# Patient Record
Sex: Female | Born: 1985 | Hispanic: No | Marital: Married | Smoking: Never smoker
Health system: Southern US, Community
[De-identification: ages and names within clinical notes are randomized; demographics above are authoritative.]

## PROBLEM LIST (undated history)

## (undated) ENCOUNTER — Inpatient Hospital Stay (HOSPITAL_COMMUNITY): Payer: Self-pay

## (undated) DIAGNOSIS — E039 Hypothyroidism, unspecified: Secondary | ICD-10-CM

## (undated) DIAGNOSIS — Z8759 Personal history of other complications of pregnancy, childbirth and the puerperium: Secondary | ICD-10-CM

## (undated) DIAGNOSIS — F329 Major depressive disorder, single episode, unspecified: Secondary | ICD-10-CM

## (undated) DIAGNOSIS — F32A Depression, unspecified: Secondary | ICD-10-CM

## (undated) DIAGNOSIS — O24419 Gestational diabetes mellitus in pregnancy, unspecified control: Secondary | ICD-10-CM

## (undated) DIAGNOSIS — Z8719 Personal history of other diseases of the digestive system: Secondary | ICD-10-CM

## (undated) DIAGNOSIS — K831 Obstruction of bile duct: Secondary | ICD-10-CM

## (undated) HISTORY — DX: Gestational diabetes mellitus in pregnancy, unspecified control: O24.419

## (undated) HISTORY — DX: Depression, unspecified: F32.A

## (undated) HISTORY — DX: Personal history of other diseases of the digestive system: Z87.59

## (undated) HISTORY — DX: Personal history of other diseases of the digestive system: Z87.19

## (undated) HISTORY — DX: Major depressive disorder, single episode, unspecified: F32.9

## (undated) HISTORY — PX: NO PAST SURGERIES: SHX2092

---

## 2016-07-25 LAB — OB RESULTS CONSOLE HIV ANTIBODY (ROUTINE TESTING): HIV: NONREACTIVE

## 2016-07-25 LAB — OB RESULTS CONSOLE RPR: RPR: NONREACTIVE

## 2016-07-25 LAB — OB RESULTS CONSOLE ABO/RH: RH TYPE: POSITIVE

## 2016-07-25 LAB — OB RESULTS CONSOLE HEPATITIS B SURFACE ANTIGEN: HEP B S AG: NEGATIVE

## 2016-07-25 LAB — OB RESULTS CONSOLE ANTIBODY SCREEN: Antibody Screen: NEGATIVE

## 2016-07-25 LAB — OB RESULTS CONSOLE RUBELLA ANTIBODY, IGM: RUBELLA: IMMUNE

## 2016-11-06 ENCOUNTER — Inpatient Hospital Stay (HOSPITAL_COMMUNITY)
Admission: AD | Admit: 2016-11-06 | Discharge: 2016-11-06 | Disposition: A | Discharge status: Home / Self Care | Payer: Medicaid Other | Source: Ambulatory Visit | Attending: Obstetrics and Gynecology | Admitting: Obstetrics and Gynecology

## 2016-11-06 ENCOUNTER — Encounter (HOSPITAL_COMMUNITY): Payer: Self-pay | Admitting: *Deleted

## 2016-11-06 DIAGNOSIS — O26892 Other specified pregnancy related conditions, second trimester: Secondary | ICD-10-CM | POA: Diagnosis not present

## 2016-11-06 DIAGNOSIS — O99282 Endocrine, nutritional and metabolic diseases complicating pregnancy, second trimester: Secondary | ICD-10-CM | POA: Diagnosis not present

## 2016-11-06 DIAGNOSIS — R109 Unspecified abdominal pain: Secondary | ICD-10-CM

## 2016-11-06 DIAGNOSIS — R1033 Periumbilical pain: Secondary | ICD-10-CM | POA: Insufficient documentation

## 2016-11-06 DIAGNOSIS — E0590 Thyrotoxicosis, unspecified without thyrotoxic crisis or storm: Secondary | ICD-10-CM | POA: Insufficient documentation

## 2016-11-06 DIAGNOSIS — Z3A24 24 weeks gestation of pregnancy: Secondary | ICD-10-CM | POA: Insufficient documentation

## 2016-11-06 DIAGNOSIS — Z880 Allergy status to penicillin: Secondary | ICD-10-CM | POA: Insufficient documentation

## 2016-11-06 LAB — URINALYSIS, ROUTINE W REFLEX MICROSCOPIC
BILIRUBIN URINE: NEGATIVE
Glucose, UA: NEGATIVE mg/dL
Hgb urine dipstick: NEGATIVE
KETONES UR: NEGATIVE mg/dL
LEUKOCYTES UA: NEGATIVE
NITRITE: NEGATIVE
PH: 7 (ref 5.0–8.0)
PROTEIN: NEGATIVE mg/dL
Specific Gravity, Urine: 1.003 — ABNORMAL LOW (ref 1.005–1.030)

## 2016-11-06 MED ORDER — ACETAMINOPHEN 500 MG PO TABS: 1000.0000 mg | ORAL_TABLET | Freq: Four times a day (QID) | ORAL | 5 refills | Status: DC | PRN

## 2016-11-06 NOTE — Discharge Instructions (Signed)
Abdominal Pain During Pregnancy °Belly (abdominal) pain is common during pregnancy. Most of the time, it is not a serious problem. Other times, it can be a sign that something is wrong with the pregnancy. Always tell your doctor if you have belly pain. °Follow these instructions at home: °Monitor your belly pain for any changes. The following actions may help you feel better: °· Do not have sex (intercourse) or put anything in your vagina until you feel better. °· Rest until your pain stops. °· Drink clear fluids if you feel sick to your stomach (nauseous). Do not eat solid food until you feel better. °· Only take medicine as told by your doctor. °· Keep all doctor visits as told. °Get help right away if: °· You are bleeding, leaking fluid, or pieces of tissue come out of your vagina. °· You have more pain or cramping. °· You keep throwing up (vomiting). °· You have pain when you pee (urinate) or have blood in your pee. °· You have a fever. °· You do not feel your baby moving as much. °· You feel very weak or feel like passing out. °· You have trouble breathing, with or without belly pain. °· You have a very bad headache and belly pain. °· You have fluid leaking from your vagina and belly pain. °· You keep having watery poop (diarrhea). °· Your belly pain does not go away after resting, or the pain gets worse. °This information is not intended to replace advice given to you by your health care provider. Make sure you discuss any questions you have with your health care provider. °Document Released: 10/31/2009 Document Revised: 06/20/2016 Document Reviewed: 06/11/2013 °Elsevier Interactive Patient Education © 2017 Elsevier Inc. ° °

## 2016-11-06 NOTE — MAU Provider Note (Signed)
[facility] MAU Attending Note  History     CSN: [id number]  Arrival date & time 11/06/16  1810   First Provider Initiated Contact with Patient 11/06/16 2005      Chief Complaint  Patient presents with  . Abdominal Pain    Katrina [name] is a 30 y.o. G1P0 at 8440w4d who presents to MAU today for evaluation of acute periumbilical pain for a few hours.  Thought something was wrong with baby or placenta. Denies contractions, LOF, bleeding; reports good fetal movement.  Denies any abnormal vaginal discharge, fevers, chills, sweats, dysuria, nausea, vomiting, other GI or GU symptoms or other general symptoms.   Obstetric History   G1   P0   T0   P0   A0   L0    SAB0   TAB0   Ectopic0   Multiple0   Live Births0     # Outcome Date GA Lbr Len/2nd Weight Sex Delivery Anes PTL Lv  1 Current               Past Medical History:  Diagnosis Date  . Hyperthyroidism     Past Surgical History:  Procedure Laterality Date  . NO PAST SURGERIES      No family history on file.  Social History  Substance Use Topics  . Smoking status: Not on file  . Smokeless tobacco: Not on file  . Alcohol use Not on file    Allergies  Allergen Reactions  . Penicillins Rash    Has patient had a PCN reaction causing immediate rash, facial/tongue/throat swelling, SOB or lightheadedness with hypotension: Unknown Has patient had a PCN reaction causing severe rash involving mucus membranes or skin necrosis: Unknown Has patient had a PCN reaction that required hospitalization Unknown Has patient had a PCN reaction occurring within the last 10 years: No If all of the above answers are "NO", then may proceed with Cephalosporin use.     Prescriptions Prior to Admission  Medication Sig Dispense Refill Last Dose  . levothyroxine (SYNTHROID, LEVOTHROID) 50 MCG tablet Take 50 mcg by mouth daily before breakfast.   0 Past Week at Unknown time  . Prenatal Vit-Fe Fumarate-FA (PRENATAL MULTIVITAMIN)  TABS tablet Take 1 tablet by mouth at bedtime.   11/05/2016 at Unknown time  . ranitidine (ZANTAC) 150 MG tablet Take 150 mg by mouth daily as needed for heartburn.   0 Past Week at Unknown time     Physical Exam  BP 108/63 (BP Location: Right Arm)   Pulse 85   Temp 98.8 F (37.1 C) (Oral)   Resp 18   Wt 145 lb 6.4 oz (66 kg)  GENERAL: Well-developed, well-nourished female in no acute distress  SKIN: Warm, dry and without erythema PSYCH: Normal mood and affect HEENT: Normocephalic, atraumatic.   LUNGS: Normal respiratory effort, normal breath sounds HEART: Regular rate noted ABDOMEN: Soft, nondistended, gravid. Point tenderness around umbilicus corresponding to rectus diathesis, no umbilical hernia/defect palpated.  EXTREMITIES: No edema, no cyanosis, normal range of movement   Labs and Imaging   Results for orders placed or performed during the hospital encounter of 11/06/16 (from the past 24 hour(s))  Urinalysis, Routine w reflex microscopic     Status: Abnormal   Collection Time: 11/06/16  6:56 PM  Result Value Ref Range   Color, Urine STRAW (A) YELLOW   APPearance CLEAR CLEAR   Specific Gravity, Urine 1.003 (L) 1.005 - 1.030   pH 7.0 5.0 - 8.0   Glucose,  UA NEGATIVE NEGATIVE mg/dL   Hgb urine dipstick NEGATIVE NEGATIVE   Bilirubin Urine NEGATIVE NEGATIVE   Ketones, ur NEGATIVE NEGATIVE mg/dL   Protein, ur NEGATIVE NEGATIVE mg/dL   Nitrite NEGATIVE NEGATIVE   Leukocytes, UA NEGATIVE NEGATIVE   No results found.  Assessment and Plan   1. Acute periumbilical pain   2. Abdominal pain during pregnancy in second trimester    IUP at 6860w4d Assured patient that pain is due to enlarging uterus causing muscle separation, told to follow up with OB provider for worsening pain, hernia or other symptoms.  Advised Tylenol for pain Was told to return to MAU for any pain, bleeding or other concerns, or if her condition were to change or worsen. Discharged to home in stable  condition D[facility] Henderson CloudHorvath[facility], and she agreed with my plan of care.       Medication List    TAKE these medications   acetaminophen 500 MG tablet Commonly known as:  TYLENOL Take 2 tablets (1,000 mg total) by mouth every 6 (six) hours as needed.   levothyroxine 50 MCG tablet Commonly known as:  SYNTHROID, LEVOTHROID Take 50 mcg by mouth daily before breakfast.   prenatal multivitamin Tabs tablet Take 1 tablet by mouth at bedtime.   ranitidine 150 MG tablet Commonly known as:  ZANTAC Take 150 mg by mouth daily as needed for heartburn.        Jaynie CollinsUGONNA  Violet Seabury, MD, FACOG Attending Obstetrician & Gynecologist, [facility], [facility]

## 2016-11-06 NOTE — MAU Note (Signed)
She was in dr's office today. Started having pain in abd and around her belly button.  Pain was continuous.

## 2016-11-26 NOTE — L&D Delivery Note (Signed)
Patient was C/C/+2 and pushed for 120 minutes with epidural.   NSVD  female infant, Apgars 8,9, weight P.   The patient had a mid line second degree perineal laceration repaired with 2-0 vicrylR. Fundus was firm. EBL was expected amount. Placenta was delivered intact. Vagina was clear.  Baby was vigorous and doing skin to skin with mother.  Katrina [name] A

## 2017-01-22 ENCOUNTER — Encounter (HOSPITAL_COMMUNITY): Payer: Self-pay | Admitting: *Deleted

## 2017-01-22 ENCOUNTER — Inpatient Hospital Stay (HOSPITAL_COMMUNITY)
Admission: AD | Admit: 2017-01-22 | Discharge: 2017-01-22 | Disposition: A | Discharge status: Home / Self Care | Payer: Medicaid Other | Source: Ambulatory Visit | Attending: Obstetrics and Gynecology | Admitting: Obstetrics and Gynecology

## 2017-01-22 DIAGNOSIS — O99283 Endocrine, nutritional and metabolic diseases complicating pregnancy, third trimester: Secondary | ICD-10-CM | POA: Diagnosis not present

## 2017-01-22 DIAGNOSIS — Z3A35 35 weeks gestation of pregnancy: Secondary | ICD-10-CM | POA: Insufficient documentation

## 2017-01-22 DIAGNOSIS — Z880 Allergy status to penicillin: Secondary | ICD-10-CM | POA: Diagnosis not present

## 2017-01-22 DIAGNOSIS — R102 Pelvic and perineal pain: Secondary | ICD-10-CM | POA: Diagnosis present

## 2017-01-22 DIAGNOSIS — O26893 Other specified pregnancy related conditions, third trimester: Secondary | ICD-10-CM | POA: Diagnosis not present

## 2017-01-22 DIAGNOSIS — R109 Unspecified abdominal pain: Secondary | ICD-10-CM

## 2017-01-22 DIAGNOSIS — E039 Hypothyroidism, unspecified: Secondary | ICD-10-CM | POA: Diagnosis not present

## 2017-01-22 HISTORY — DX: Hypothyroidism, unspecified: E03.9

## 2017-01-22 LAB — URINALYSIS, ROUTINE W REFLEX MICROSCOPIC
BILIRUBIN URINE: NEGATIVE
GLUCOSE, UA: NEGATIVE mg/dL
Hgb urine dipstick: NEGATIVE
Ketones, ur: NEGATIVE mg/dL
Nitrite: NEGATIVE
PH: 6 (ref 5.0–8.0)
Protein, ur: NEGATIVE mg/dL
Specific Gravity, Urine: 1.006 (ref 1.005–1.030)

## 2017-01-22 NOTE — MAU Note (Signed)
Pt reports abdominal pain since today at 10am. The pain also hurts every night when she changes position. The pain is around her umbilicus. She also was told that she has a hernia in her 6th month of pregnancy. Pain 4/10. Pt has not taken any pain medicine and doesn't want to take anything for pain. Pt was concerned about decreased movement also. She called her her doctor and they advised her to come and get checked out. Pt started to feel the baby move after drinking water.

## 2017-01-22 NOTE — MAU Provider Note (Signed)
Chief Complaint:  Abdominal Pain   First Provider Initiated Contact with Patient 01/22/17 1742      HPI: Katrina [name] is a 31 y.o. G1P0 at 6228w4d who presents to maternity admissions reporting intermittent pain in her umbilical area x 2 days.  The pain is intermittent, lasting several minutes when it occurs.  This is a new symptom. It is not improved with position change or increase water intake.  It has no associated symptoms. She reports good fetal movement, denies LOF, vaginal bleeding, vaginal itching/burning, urinary symptoms, h/a, dizziness, n/v, or fever/chills.    HPI  Past Medical History: Past Medical History:  Diagnosis Date  . Hypothyroidism     Past obstetric history: OB History  Gravida Para Term Preterm AB Living  1            SAB TAB Ectopic Multiple Live Births               # Outcome Date GA Lbr Len/2nd Weight Sex Delivery Anes PTL Lv  1 Current               Past Surgical History: Past Surgical History:  Procedure Laterality Date  . NO PAST SURGERIES      Family History: No family history on file.  Social History: Social History  Substance Use Topics  . Smoking status: Never Smoker  . Smokeless tobacco: Never Used  . Alcohol use Not on file    Allergies:  Allergies  Allergen Reactions  . Penicillins Rash    Has patient had a PCN reaction causing immediate rash, facial/tongue/throat swelling, SOB or lightheadedness with hypotension: Unknown Has patient had a PCN reaction causing severe rash involving mucus membranes or skin necrosis: Unknown Has patient had a PCN reaction that required hospitalization Unknown Has patient had a PCN reaction occurring within the last 10 years: No If all of the above answers are "NO", then may proceed with Cephalosporin use.     Meds:  Prescriptions Prior to Admission  Medication Sig Dispense Refill Last Dose  . acetaminophen (TYLENOL) 500 MG tablet Take 2 tablets (1,000 mg total) by mouth every 6 (six)  hours as needed. 30 tablet 5 Past Month at Unknown time  . levothyroxine (SYNTHROID, LEVOTHROID) 75 MCG tablet Take 75 mcg by mouth daily before breakfast.   01/22/2017 at Unknown time  . Prenatal Vit-Fe Fumarate-FA (PRENATAL MULTIVITAMIN) TABS tablet Take 1 tablet by mouth at bedtime.   01/22/2017 at Unknown time  . ranitidine (ZANTAC) 150 MG tablet Take 150 mg by mouth daily as needed for heartburn.   0 01/22/2017 at Unknown time    ROS:  Review of Systems  Constitutional: Negative for chills, fatigue and fever.  Eyes: Negative for visual disturbance.  Respiratory: Negative for shortness of breath.   Cardiovascular: Negative for chest pain.  Gastrointestinal: Positive for abdominal pain. Negative for nausea and vomiting.  Genitourinary: Negative for difficulty urinating, dysuria, flank pain, pelvic pain, vaginal bleeding, vaginal discharge and vaginal pain.  Neurological: Negative for dizziness and headaches.  Psychiatric/Behavioral: Negative.      I have reviewed patient's Past Medical Hx, Surgical Hx, Family Hx, Social Hx, medications and allergies.   Physical Exam  Patient Vitals for the past 24 hrs:  BP Temp Temp src Pulse Resp Weight  01/22/17 1701 120/72 98.2 F (36.8 C) Oral 89 16 164 lb 8 oz (74.6 kg)   Constitutional: Well-developed, well-nourished female in no acute distress.  Cardiovascular: normal rate Respiratory: normal effort GI: Abd  soft, non-tender, gravid appropriate for gestational age.  MS: Extremities nontender, no edema, normal ROM Neurologic: Alert and oriented x 4.  GU: Neg CVAT.   Dilation: 1 Effacement (%): Thick Cervical Position: Posterior Presentation: Vertex Exam by:: Leftwich-Kirby, L CNM  FHT:  Baseline 145 , moderate variability, accelerations present, no decelerations Contractions: None on toco or to palpation    Labs: Results for orders placed or performed during the hospital encounter of 01/22/17 (from the past 24 hour(s))  Urinalysis,  Routine w reflex microscopic     Status: Abnormal   Collection Time: 01/22/17  5:05 PM  Result Value Ref Range   Color, Urine STRAW (A) YELLOW   APPearance HAZY (A) CLEAR   Specific Gravity, Urine 1.006 1.005 - 1.030   pH 6.0 5.0 - 8.0   Glucose, UA NEGATIVE NEGATIVE mg/dL   Hgb urine dipstick NEGATIVE NEGATIVE   Bilirubin Urine NEGATIVE NEGATIVE   Ketones, ur NEGATIVE NEGATIVE mg/dL   Protein, ur NEGATIVE NEGATIVE mg/dL   Nitrite NEGATIVE NEGATIVE   Leukocytes, UA TRACE (A) NEGATIVE   RBC / HPF 0-5 0 - 5 RBC/hpf   WBC, UA 0-5 0 - 5 WBC/hpf   Bacteria, UA RARE (A) NONE SEEN   Squamous Epithelial / LPF 6-30 (A) NONE SEEN   Mucous PRESENT       Imaging:  No results found.  MAU Course/MDM: I have ordered labs and reviewed results.  NST reviewed and reactive No evidence of preterm labor or acute abnormality today Reassurance provided to pt Consult Dr Mindi Slicker with presentation, exam findings and test results.  D/C home with preterm labor precautions. Keep scheduled appts in office. Pt stable at time of discharge.  Today's evaluation included a work-up for preterm labor which can be life-threatening for both mom and baby.  Assessment: 1. Abdominal pain during pregnancy in third trimester     Plan: Discharge home Labor precautions and fetal kick counts  Follow-up Information    [facility] Follow up.   Why:  As scheduled on Monday, return to MAU as needed for emergencies. Contact information: [address]  [address] [place] [address] [phone]          Allergies as of 01/22/2017      Reactions   Penicillins Rash   Has patient had a PCN reaction causing immediate rash, facial/tongue/throat swelling, SOB or lightheadedness with hypotension: Unknown Has patient had a PCN reaction causing severe rash involving mucus membranes or skin necrosis: Unknown Has patient had a PCN reaction that required hospitalization Unknown Has patient had a PCN  reaction occurring within the last 10 years: No If all of the above answers are "NO", then may proceed with Cephalosporin use.      Medication List    TAKE these medications   acetaminophen 500 MG tablet Commonly known as:  TYLENOL Take 2 tablets (1,000 mg total) by mouth every 6 (six) hours as needed.   levothyroxine 75 MCG tablet Commonly known as:  SYNTHROID, LEVOTHROID Take 75 mcg by mouth daily before breakfast.   prenatal multivitamin Tabs tablet Take 1 tablet by mouth at bedtime.   ranitidine 150 MG tablet Commonly known as:  ZANTAC Take 150 mg by mouth daily as needed for heartburn.       Sharen Counter Certified Nurse-Midwife 01/22/2017 6:23 PM

## 2017-01-22 NOTE — Discharge Instructions (Signed)
Abdominal Pain During Pregnancy  Abdominal pain is common in pregnancy. Most of the time, it does not cause harm. There are many causes of abdominal pain. Some causes are more serious than others and sometimes the cause is not known. Abdominal pain can be a sign that something is very wrong with the pregnancy or the pain may have nothing to do with the pregnancy. Always tell your health care provider if you have any abdominal pain.  Follow these instructions at home:  · Do not have sex or put anything in your vagina until your symptoms go away completely.  · Watch your abdominal pain for any changes.  · Get plenty of rest until your pain improves.  · Drink enough fluid to keep your urine clear or pale yellow.  · Take over-the-counter or prescription medicines only as told by your health care provider.  · Keep all follow-up visits as told by your health care provider. This is important.  Contact a health care provider if:  · You have a fever.  · Your pain gets worse or you have cramping.  · Your pain continues after resting.  Get help right away if:  · You are bleeding, leaking fluid, or passing tissue from the vagina.  · You have vomiting or diarrhea that does not go away.  · You have painful or bloody urination.  · You notice a decrease in your baby's movements.  · You feel very weak or faint.  · You have shortness of breath.  · You develop a severe headache with abdominal pain.  · You have abnormal vaginal discharge with abdominal pain.  This information is not intended to replace advice given to you by your health care provider. Make sure you discuss any questions you have with your health care provider.  Document Released: 11/12/2005 Document Revised: 08/23/2016 Document Reviewed: 06/11/2013  Elsevier Interactive Patient Education © 2017 Elsevier Inc.

## 2017-01-22 NOTE — MAU Note (Signed)
Had pain around umbilicus at 1000.  Pain comes and goes, not as bad now.  Baby was not moving as much this morning.  Called dr, drank cold water, was on left side, then baby got active.

## 2017-01-28 LAB — OB RESULTS CONSOLE GBS: STREP GROUP B AG: NEGATIVE

## 2017-02-05 ENCOUNTER — Encounter (HOSPITAL_COMMUNITY): Payer: Self-pay | Admitting: *Deleted

## 2017-02-05 ENCOUNTER — Inpatient Hospital Stay (HOSPITAL_COMMUNITY)
Admission: AD | Admit: 2017-02-05 | Discharge: 2017-02-08 | DRG: 775 | Disposition: A | Discharge status: Home / Self Care | Payer: Medicaid Other | Source: Ambulatory Visit | Attending: Obstetrics and Gynecology | Admitting: Obstetrics and Gynecology

## 2017-02-05 ENCOUNTER — Inpatient Hospital Stay (HOSPITAL_COMMUNITY): Payer: Medicaid Other | Admitting: Anesthesiology

## 2017-02-05 DIAGNOSIS — O99284 Endocrine, nutritional and metabolic diseases complicating childbirth: Secondary | ICD-10-CM | POA: Diagnosis present

## 2017-02-05 DIAGNOSIS — E039 Hypothyroidism, unspecified: Secondary | ICD-10-CM | POA: Diagnosis present

## 2017-02-05 DIAGNOSIS — O2662 Liver and biliary tract disorders in childbirth: Secondary | ICD-10-CM | POA: Diagnosis present

## 2017-02-05 DIAGNOSIS — Z3A37 37 weeks gestation of pregnancy: Secondary | ICD-10-CM

## 2017-02-05 DIAGNOSIS — K831 Obstruction of bile duct: Secondary | ICD-10-CM | POA: Diagnosis present

## 2017-02-05 DIAGNOSIS — O26619 Liver and biliary tract disorders in pregnancy, unspecified trimester: Secondary | ICD-10-CM | POA: Diagnosis present

## 2017-02-05 LAB — CBC
HCT: 32.4 % — ABNORMAL LOW (ref 36.0–46.0)
HEMOGLOBIN: 11.1 g/dL — AB (ref 12.0–15.0)
MCH: 30.5 pg (ref 26.0–34.0)
MCHC: 34.3 g/dL (ref 30.0–36.0)
MCV: 89 fL (ref 78.0–100.0)
PLATELETS: 391 10*3/uL (ref 150–400)
RBC: 3.64 MIL/uL — AB (ref 3.87–5.11)
RDW: 13.8 % (ref 11.5–15.5)
WBC: 11.5 10*3/uL — ABNORMAL HIGH (ref 4.0–10.5)

## 2017-02-05 LAB — TYPE AND SCREEN
ABO/RH(D): B POS
ANTIBODY SCREEN: NEGATIVE

## 2017-02-05 LAB — ABO/RH: ABO/RH(D): B POS

## 2017-02-05 MED ORDER — FENTANYL 2.5 MCG/ML BUPIVACAINE 1/10 % EPIDURAL INFUSION (WH - ANES)
14.0000 mL/h | INTRAMUSCULAR | Status: DC | PRN
  Administered 2017-02-05 – 2017-02-06 (×2): 14 mL/h via EPIDURAL
  Filled 2017-02-05 (×2): qty 100

## 2017-02-05 MED ORDER — DIPHENHYDRAMINE HCL 50 MG/ML IJ SOLN: 12.5000 mg | INTRAMUSCULAR | Status: DC | PRN

## 2017-02-05 MED ORDER — EPHEDRINE 5 MG/ML INJ
10.0000 mg | INTRAVENOUS | Status: DC | PRN
  Filled 2017-02-05: qty 4

## 2017-02-05 MED ORDER — FENTANYL 2.5 MCG/ML BUPIVACAINE 1/10 % EPIDURAL INFUSION (WH - ANES): 14.0000 mL/h | INTRAMUSCULAR | Status: DC | PRN

## 2017-02-05 MED ORDER — LACTATED RINGERS IV SOLN
INTRAVENOUS | Status: DC
  Administered 2017-02-05 – 2017-02-06 (×3): via INTRAVENOUS

## 2017-02-05 MED ORDER — DIPHENHYDRAMINE HCL 50 MG/ML IJ SOLN
12.5000 mg | INTRAMUSCULAR | Status: DC | PRN
  Administered 2017-02-05: 12.5 mg via INTRAVENOUS
  Filled 2017-02-05: qty 1

## 2017-02-05 MED ORDER — LIDOCAINE HCL (PF) 1 % IJ SOLN
INTRAMUSCULAR | Status: DC | PRN
  Administered 2017-02-05: 10 mL via EPIDURAL

## 2017-02-05 MED ORDER — OXYCODONE-ACETAMINOPHEN 5-325 MG PO TABS: 1.0000 | ORAL_TABLET | ORAL | Status: DC | PRN

## 2017-02-05 MED ORDER — SOD CITRATE-CITRIC ACID 500-334 MG/5ML PO SOLN: 30.0000 mL | ORAL | Status: DC | PRN

## 2017-02-05 MED ORDER — OXYTOCIN 40 UNITS IN LACTATED RINGERS INFUSION - SIMPLE MED
1.0000 m[IU]/min | INTRAVENOUS | Status: DC
  Administered 2017-02-05: 2 m[IU]/min via INTRAVENOUS
  Filled 2017-02-05: qty 1000

## 2017-02-05 MED ORDER — ONDANSETRON HCL 4 MG/2ML IJ SOLN: 4.0000 mg | Freq: Four times a day (QID) | INTRAMUSCULAR | Status: DC | PRN

## 2017-02-05 MED ORDER — EPHEDRINE 5 MG/ML INJ: 10.0000 mg | INTRAVENOUS | Status: DC | PRN

## 2017-02-05 MED ORDER — PHENYLEPHRINE 40 MCG/ML (10ML) SYRINGE FOR IV PUSH (FOR BLOOD PRESSURE SUPPORT)
80.0000 ug | PREFILLED_SYRINGE | INTRAVENOUS | Status: DC | PRN
  Filled 2017-02-05: qty 5

## 2017-02-05 MED ORDER — PHENYLEPHRINE 40 MCG/ML (10ML) SYRINGE FOR IV PUSH (FOR BLOOD PRESSURE SUPPORT)
80.0000 ug | PREFILLED_SYRINGE | INTRAVENOUS | Status: DC | PRN
  Filled 2017-02-05: qty 10
  Filled 2017-02-05: qty 5

## 2017-02-05 MED ORDER — PHENYLEPHRINE 40 MCG/ML (10ML) SYRINGE FOR IV PUSH (FOR BLOOD PRESSURE SUPPORT): 80.0000 ug | PREFILLED_SYRINGE | INTRAVENOUS | Status: DC | PRN

## 2017-02-05 MED ORDER — OXYCODONE-ACETAMINOPHEN 5-325 MG PO TABS: 2.0000 | ORAL_TABLET | ORAL | Status: DC | PRN

## 2017-02-05 MED ORDER — OXYTOCIN BOLUS FROM INFUSION
500.0000 mL | Freq: Once | INTRAVENOUS | Status: AC
  Administered 2017-02-06: 500 mL via INTRAVENOUS

## 2017-02-05 MED ORDER — OXYTOCIN 40 UNITS IN LACTATED RINGERS INFUSION - SIMPLE MED
2.5000 [IU]/h | INTRAVENOUS | Status: DC
  Administered 2017-02-06: 2.5 [IU]/h via INTRAVENOUS

## 2017-02-05 MED ORDER — LACTATED RINGERS IV SOLN: 500.0000 mL | INTRAVENOUS | Status: DC | PRN

## 2017-02-05 MED ORDER — TERBUTALINE SULFATE 1 MG/ML IJ SOLN
0.2500 mg | Freq: Once | INTRAMUSCULAR | Status: DC | PRN
  Filled 2017-02-05: qty 1

## 2017-02-05 MED ORDER — LIDOCAINE HCL (PF) 1 % IJ SOLN
30.0000 mL | INTRAMUSCULAR | Status: DC | PRN
  Filled 2017-02-05: qty 30

## 2017-02-05 MED ORDER — ACETAMINOPHEN 325 MG PO TABS: 650.0000 mg | ORAL_TABLET | ORAL | Status: DC | PRN

## 2017-02-05 MED ORDER — LACTATED RINGERS IV SOLN: 500.0000 mL | Freq: Once | INTRAVENOUS | Status: DC

## 2017-02-05 MED ORDER — FLEET ENEMA 7-19 GM/118ML RE ENEM: 1.0000 | ENEMA | Freq: Every day | RECTAL | Status: DC | PRN

## 2017-02-05 MED ORDER — LACTATED RINGERS IV SOLN
500.0000 mL | Freq: Once | INTRAVENOUS | Status: AC
  Administered 2017-02-05: 500 mL via INTRAVENOUS

## 2017-02-05 NOTE — Anesthesia Pain Management Evaluation Note (Signed)
  CRNA Pain Management Visit Note  Patient: Katrina [name], 31 y.o., female  "Hello I am a [facility] of the [facility]. We have an anesthesia team available at all times to provide care throughout the hospital, including epidural management and anesthesia for C-section. I don't know your plan for the delivery whether it a natural birth, water birth, IV sedation, nitrous supplementation, doula or epidural, but we want to meet your pain goals."   1.Was your pain managed to your expectations on prior hospitalizations?   No prior hospitalizations  2.What is your expectation for pain management during this hospitalization?     Epidural  3.How can we help you reach that goal?   Record the patient's initial score and the patient's pain goal.   Pain: 0  Pain Goal: 6 [facility] wants you to be able to say your pain was always managed very well.  Laban EmperorMalinova,Felica Chargois Hristova 02/05/2017

## 2017-02-05 NOTE — Anesthesia Preprocedure Evaluation (Signed)
Anesthesia Evaluation  Patient identified by MRN, date of birth, ID band Patient awake    Reviewed: Allergy & Precautions, NPO status , Patient's Chart, lab work & pertinent test results  Airway Mallampati: II  TM Distance: >3 FB Neck ROM: Full    Dental no notable dental hx.    Pulmonary neg pulmonary ROS,    Pulmonary exam normal breath sounds clear to auscultation       Cardiovascular negative cardio ROS Normal cardiovascular exam Rhythm:Regular Rate:Normal     Neuro/Psych negative neurological ROS  negative psych ROS   GI/Hepatic negative GI ROS, Neg liver ROS,   Endo/Other  negative endocrine ROSHypothyroidism   Renal/GU negative Renal ROS  negative genitourinary   Musculoskeletal negative musculoskeletal ROS (+)   Abdominal   Peds negative pediatric ROS (+)  Hematology negative hematology ROS (+)   Anesthesia Other Findings   Reproductive/Obstetrics negative OB ROS                             Anesthesia Physical Anesthesia Plan  ASA: II  Anesthesia Plan: Epidural   Post-op Pain Management:    Induction:   Airway Management Planned: Natural Airway  Additional Equipment:   Intra-op Plan:   Post-operative Plan:   Informed Consent:   Plan Discussed with:   Anesthesia Plan Comments:         Anesthesia Quick Evaluation

## 2017-02-05 NOTE — Anesthesia Procedure Notes (Signed)
Epidural Patient location during procedure: OB Start time: 02/05/2017 8:12 PM End time: 02/05/2017 8:37 PM  Staffing Anesthesiologist: Anitra LauthMILLER, Nakina Spatz RAY Performed: anesthesiologist   Preanesthetic Checklist Completed: patient identified, site marked, surgical consent, pre-op evaluation, timeout performed, IV checked, risks and benefits discussed and monitors and equipment checked  Epidural Patient position: sitting Prep: DuraPrep Patient monitoring: heart rate, cardiac monitor, continuous pulse ox and blood pressure Approach: midline Location: L2-L3 Injection technique: LOR saline  Needle:  Needle type: Tuohy  Needle gauge: 17 G Needle length: 9 cm Needle insertion depth: 5 cm Catheter type: closed end flexible Catheter size: 20 Guage Catheter at skin depth: 8 cm Test dose: negative  Assessment Events: blood not aspirated, injection not painful, no injection resistance, negative IV test and no paresthesia  Additional Notes Reason for block:procedure for pain

## 2017-02-05 NOTE — H&P (Signed)
31 y.o. 7557w4d  G1P0 comes in for induction at term for cholestasis of pregnancy- bile acids all elevated with total bile acid >14.  Otherwise has good fetal movement and no bleeding.  Past Medical History:  Diagnosis Date  . Hypothyroidism     Past Surgical History:  Procedure Laterality Date  . NO PAST SURGERIES      OB History  Gravida Para Term Preterm AB Living  1            SAB TAB Ectopic Multiple Live Births               # Outcome Date GA Lbr Len/2nd Weight Sex Delivery Anes PTL Lv  1 Current               Social History   Social History  . Marital status: Married    Spouse name: N/A  . Number of children: N/A  . Years of education: N/A   Occupational History  . Not on file.   Social History Main Topics  . Smoking status: Never Smoker  . Smokeless tobacco: Never Used  . Alcohol use Not on file  . Drug use: No  . Sexual activity: Not on file   Other Topics Concern  . Not on file   Social History Narrative  . No narrative on file   Penicillins    Prenatal Transfer Tool  Maternal Diabetes: No Genetic Screening: Normal Maternal Ultrasounds/Referrals: Normal Fetal Ultrasounds or other Referrals:  None Maternal Substance Abuse:  No Significant Maternal Medications:  Meds include: Syntroid Significant Maternal Lab Results: Lab values include: Other: elevated bile acids  Other PNC: otherwise uncomplicated.    There were no vitals filed for this visit.   Lungs/Cor:  NAD Abdomen:  soft, gravid Ex:  no cords, erythema SVE:  3/60/-3 FHTs:  130s, good STV, NST R Toco:  q occ   A/P   Term with cholestasis of pregnancy.  GBS neg.  Dolton Shaker A

## 2017-02-06 ENCOUNTER — Encounter (HOSPITAL_COMMUNITY): Payer: Self-pay

## 2017-02-06 LAB — RPR: RPR: NONREACTIVE

## 2017-02-06 MED ORDER — IBUPROFEN 800 MG PO TABS
800.0000 mg | ORAL_TABLET | Freq: Three times a day (TID) | ORAL | Status: DC
  Administered 2017-02-06 – 2017-02-08 (×7): 800 mg via ORAL
  Filled 2017-02-06 (×7): qty 1

## 2017-02-06 MED ORDER — ACETAMINOPHEN 325 MG PO TABS
650.0000 mg | ORAL_TABLET | ORAL | Status: DC | PRN
Start: 2017-02-06 — End: 2017-02-08
  Administered 2017-02-06: 650 mg via ORAL
  Filled 2017-02-06: qty 2

## 2017-02-06 MED ORDER — COCONUT OIL OIL
1.0000 | TOPICAL_OIL | Status: DC | PRN
Start: 2017-02-06 — End: 2017-02-08

## 2017-02-06 MED ORDER — SENNOSIDES-DOCUSATE SODIUM 8.6-50 MG PO TABS
2.0000 | ORAL_TABLET | ORAL | Status: DC
  Administered 2017-02-06 – 2017-02-07 (×2): 2 via ORAL
  Filled 2017-02-06 (×2): qty 2

## 2017-02-06 MED ORDER — SIMETHICONE 80 MG PO CHEW: 80.0000 mg | CHEWABLE_TABLET | ORAL | Status: DC | PRN

## 2017-02-06 MED ORDER — MAGNESIUM HYDROXIDE 400 MG/5ML PO SUSP: 30.0000 mL | ORAL | Status: DC | PRN

## 2017-02-06 MED ORDER — DIBUCAINE 1 % RE OINT: 1.0000 "application " | TOPICAL_OINTMENT | RECTAL | Status: DC | PRN

## 2017-02-06 MED ORDER — FAMOTIDINE 20 MG PO TABS
10.0000 mg | ORAL_TABLET | Freq: Two times a day (BID) | ORAL | Status: DC
  Administered 2017-02-06 – 2017-02-07 (×3): 10 mg via ORAL
  Filled 2017-02-06 (×4): qty 1

## 2017-02-06 MED ORDER — MEASLES, MUMPS & RUBELLA VAC ~~LOC~~ INJ: 0.5000 mL | INJECTION | Freq: Once | SUBCUTANEOUS | Status: DC

## 2017-02-06 MED ORDER — OXYCODONE-ACETAMINOPHEN 5-325 MG PO TABS
1.0000 | ORAL_TABLET | ORAL | Status: DC | PRN
  Administered 2017-02-07 – 2017-02-08 (×2): 1 via ORAL
  Filled 2017-02-06 (×2): qty 1

## 2017-02-06 MED ORDER — METHYLERGONOVINE MALEATE 0.2 MG PO TABS: 0.2000 mg | ORAL_TABLET | ORAL | Status: DC | PRN

## 2017-02-06 MED ORDER — SODIUM CHLORIDE 0.9% FLUSH: 3.0000 mL | INTRAVENOUS | Status: DC | PRN

## 2017-02-06 MED ORDER — PRENATAL MULTIVITAMIN CH
1.0000 | ORAL_TABLET | Freq: Every day | ORAL | Status: DC
  Administered 2017-02-06 – 2017-02-07 (×2): 1 via ORAL
  Filled 2017-02-06 (×2): qty 1

## 2017-02-06 MED ORDER — OXYCODONE-ACETAMINOPHEN 5-325 MG PO TABS: 2.0000 | ORAL_TABLET | ORAL | Status: DC | PRN

## 2017-02-06 MED ORDER — WITCH HAZEL-GLYCERIN EX PADS: 1.0000 "application " | MEDICATED_PAD | CUTANEOUS | Status: DC | PRN

## 2017-02-06 MED ORDER — METHYLERGONOVINE MALEATE 0.2 MG/ML IJ SOLN: 0.2000 mg | INTRAMUSCULAR | Status: DC | PRN

## 2017-02-06 MED ORDER — ONDANSETRON HCL 4 MG PO TABS
4.0000 mg | ORAL_TABLET | ORAL | Status: DC | PRN
  Administered 2017-02-06: 4 mg via ORAL
  Filled 2017-02-06: qty 1

## 2017-02-06 MED ORDER — DIPHENHYDRAMINE HCL 25 MG PO CAPS: 25.0000 mg | ORAL_CAPSULE | Freq: Four times a day (QID) | ORAL | Status: DC | PRN

## 2017-02-06 MED ORDER — SODIUM CHLORIDE 0.9% FLUSH: 3.0000 mL | Freq: Two times a day (BID) | INTRAVENOUS | Status: DC

## 2017-02-06 MED ORDER — LEVOTHYROXINE SODIUM 75 MCG PO TABS
75.0000 ug | ORAL_TABLET | Freq: Every day | ORAL | Status: DC
Start: 2017-02-06 — End: 2017-02-08
  Administered 2017-02-07 – 2017-02-08 (×2): 75 ug via ORAL
  Filled 2017-02-06 (×3): qty 1

## 2017-02-06 MED ORDER — ZOLPIDEM TARTRATE 5 MG PO TABS: 5.0000 mg | ORAL_TABLET | Freq: Every evening | ORAL | Status: DC | PRN

## 2017-02-06 MED ORDER — ONDANSETRON HCL 4 MG/2ML IJ SOLN: 4.0000 mg | INTRAMUSCULAR | Status: DC | PRN

## 2017-02-06 MED ORDER — TETANUS-DIPHTH-ACELL PERTUSSIS 5-2.5-18.5 LF-MCG/0.5 IM SUSP: 0.5000 mL | Freq: Once | INTRAMUSCULAR | Status: DC

## 2017-02-06 MED ORDER — FERROUS SULFATE 325 (65 FE) MG PO TABS
325.0000 mg | ORAL_TABLET | Freq: Two times a day (BID) | ORAL | Status: DC
  Administered 2017-02-06 – 2017-02-08 (×4): 325 mg via ORAL
  Filled 2017-02-06 (×4): qty 1

## 2017-02-06 MED ORDER — BENZOCAINE-MENTHOL 20-0.5 % EX AERO
1.0000 "application " | INHALATION_SPRAY | CUTANEOUS | Status: DC | PRN
  Filled 2017-02-06: qty 56

## 2017-02-06 MED ORDER — SODIUM CHLORIDE 0.9 % IV SOLN: 250.0000 mL | INTRAVENOUS | Status: DC | PRN

## 2017-02-06 NOTE — Progress Notes (Addendum)
Pt up to void at this time. Pt was unable to void. I&O cath performed, pt emptied of 700cc clear, yellow urine. Sheryn BisonGordon, Dasiah Hooley Warner

## 2017-02-06 NOTE — Progress Notes (Signed)
Epidural removed at this time. Blue tip intact, confirmed with Minus Libertyhristy Leshowitz, RN and shown to pt. Small amount of bleeding around site prior to removal, bandaid applied.

## 2017-02-06 NOTE — Progress Notes (Signed)
[facility]. Report given to [place], Charity fundraiserN. Sheryn BisonGordon, Micalah Cabezas Warner

## 2017-02-06 NOTE — Lactation Note (Signed)
This note was copied from a baby's chart. Lactation Consultation Note  Patient Name: Girl Katrina Katrina [name]: 02/06/2017 Reason for consult: Initial assessment Baby at 11 hr of life. Upon entry baby was sleeping. Offered latch help and mom declined. Mom denies breast or nipple pain. She asked for formula samples to use when she goes back to work. She plans to ebf while in the hospital. Discussed baby behavior, feeding frequency, baby belly size, voids, wt loss, breast changes, and nipple care. Mom stated she can manually express and has spoon in room. Given lactation handouts. Aware of OP services and support group.     Maternal Data Has patient been taught Hand Expression?: Yes Does the patient have breastfeeding experience prior to this delivery?: No  Feeding    LATCH Score/Interventions                      Lactation Tools Discussed/Used WIC Program: No   Consult Status Consult Status: Follow-up Katrina [name]: 02/07/17 Follow-up type: In-patient    Katrina Katrina [name] 02/06/2017, 5:23 PM

## 2017-02-06 NOTE — Anesthesia Postprocedure Evaluation (Signed)
Anesthesia Post Note  Patient: Katrina [name]  Procedure(s) Performed: * No procedures listed *  Patient location during evaluation: Mother Baby Anesthesia Type: Epidural Level of consciousness: awake and alert Pain management: pain level controlled Vital Signs Assessment: post-procedure vital signs reviewed and stable Respiratory status: spontaneous breathing Cardiovascular status: blood pressure returned to baseline Postop Assessment: no headache, patient able to bend at knees, no backache, no signs of nausea or vomiting, epidural receding and adequate PO intake Anesthetic complications: no        Last Vitals:  Vitals:   02/06/17 0815 02/06/17 0913  BP: 114/70 (!) 109/55  Pulse: 61 66  Resp:    Temp: 36.8 C 36.6 C    Last Pain:  Vitals:   02/06/17 0913  TempSrc: Oral  PainSc:    Pain Goal:                 [name], [name]

## 2017-02-07 LAB — CBC
HEMATOCRIT: 30 % — AB (ref 36.0–46.0)
HEMOGLOBIN: 10.3 g/dL — AB (ref 12.0–15.0)
MCH: 30.9 pg (ref 26.0–34.0)
MCHC: 34.3 g/dL (ref 30.0–36.0)
MCV: 90.1 fL (ref 78.0–100.0)
Platelets: 352 10*3/uL (ref 150–400)
RBC: 3.33 MIL/uL — ABNORMAL LOW (ref 3.87–5.11)
RDW: 14.2 % (ref 11.5–15.5)
WBC: 15.2 10*3/uL — ABNORMAL HIGH (ref 4.0–10.5)

## 2017-02-07 NOTE — Lactation Note (Signed)
This note was copied from a baby's chart. Lactation Consultation Note  Family sleeping.  Did speak to mother briefly but will check on family later tonight.  Patient Name: Katrina [name] WUJWJ'XToday's Date: 02/07/2017     Maternal Data    Feeding    LATCH Score/Interventions                      Lactation Tools Discussed/Used     Consult Status      Dahlia ByesBerkelhammer, Meko Masterson [facility] 02/07/2017, 5:38 PM

## 2017-02-07 NOTE — Progress Notes (Signed)
Post Partum Day 1 Subjective: no complaints, up ad lib, voiding and tolerating PO  Objective: Blood pressure 110/67, pulse 78, temperature 97.9 F (36.6 C), resp. rate 18, height 5\' 2"  (1.575 m), weight 74.4 kg (164 lb), SpO2 99 %, unknown if currently breastfeeding.  Physical Exam:  General: alert, cooperative and appears stated age Lochia: appropriate Uterine Fundus: firm DVT Evaluation: No evidence of DVT seen on physical exam.   Recent Labs  02/05/17 1536 02/07/17 0644  HGB 11.1* 10.3*  HCT 32.4* 30.0*    Assessment/Plan: Plan for discharge tomorrow and Breastfeeding   LOS: 2 days   Husayn Reim H. 02/07/2017, 10:06 AM

## 2017-02-08 ENCOUNTER — Ambulatory Visit: Payer: Self-pay

## 2017-02-08 MED ORDER — IBUPROFEN 800 MG PO TABS: 800.0000 mg | ORAL_TABLET | Freq: Three times a day (TID) | ORAL | 0 refills | Status: DC

## 2017-02-08 NOTE — Discharge Summary (Signed)
Obstetric Discharge Summary Reason for Admission: induction of labor and cholestasis of pregnancy Prenatal Procedures: ultrasound Intrapartum Procedures: spontaneous vaginal delivery Postpartum Procedures: none Complications-Operative and Postpartum: 2nd degree perineal laceration Hemoglobin  Date Value Ref Range Status  02/07/2017 10.3 (L) 12.0 - 15.0 g/dL Final   HCT  Date Value Ref Range Status  02/07/2017 30.0 (L) 36.0 - 46.0 % Final    Physical Exam:  General: alert and cooperative Lochia: appropriate Uterine Fundus: firm DVT Evaluation: No evidence of DVT seen on physical exam.  Discharge Diagnoses: Term Pregnancy-delivered  Discharge Information: Date: 02/08/2017 Activity: pelvic rest Diet: routine Medications: PNV and Ibuprofen Condition: stable Instructions: refer to practice specific booklet Discharge to: home Follow-up Information    HORVATH,MICHELLE A, MD Follow up in 4 week(s).   Specialty:  Obstetrics and Gynecology Contact information: [address] [place] [address] [phone]           Newborn Data: Live born female  Birth Weight: 6 lb 6.6 oz (2909 g) APGAR: 8, 9  Home with mother.  Katrina AspenCALLAHAN, Katrina [name] 02/08/2017, 11:10 AM

## 2017-02-08 NOTE — Lactation Note (Signed)
This note was copied from a baby's chart. Lactation Consultation Note  Patient Name: Katrina [name] LKGMW'NToday's Date: 02/08/2017 Reason for consult: Follow-up assessment;Infant weight loss (6% weight loss , early term infant, family declined interpreter see LC note ) please see Hermelinda MedicusStephanie [facility] caring for dyad concerning situation with interpreter - LC was present in the room at the same time.  After Judeth CornfieldStephanie finished her D/C teaching , per mom and aunt understood breast feeding review.  Dad walked back in the room and when asked if he had any questions regarding breast feeding , responded no.  Baby is 2952 1/2 hours old,  Has per mom has been to the breast at 10:30 for 20 mins , and breast are feeling fuller and heavier and hearing more swallows. LC reviewed doc flow sheets , latch latch score 8 ,  And has been breast feeding consistently.  Sore nipple and engorgement prevention and tx reviewed.  Mom has a hand pump for D/C, ( per mom RN gave her one )  LC discussed nutritive feeding vs non - nutritive feeding patterns and the importance of STS feedings until the baby can stay awake for a feeding.  Per mom, dad , and aunt had no further questions.  Mother informed of post-discharge support and given phone number to the lactation department, including services for phone call assistance; out-patient appointments; and breastfeeding support group. List of other breastfeeding resources in the community given in the handout. Encouraged mother to call for problems or concerns related to breastfeeding.     Maternal Data Has patient been taught Hand Expression?:  (per mom comfortable with hand expressing )  Feeding Feeding Type:  (baby last fed at 1030 for 20 mins per mom ) Length of feed: 20 min (per mom reports swallows )  LATCH Score/Interventions                Intervention(s): Breastfeeding basics reviewed     Lactation Tools Discussed/Used Tools: Pump (per mom has a  hand pump ) Breast pump type: Manual   Consult Status Consult Status: Complete Date: 02/08/17    Kathrin GreathouseMargaret Ann Cirilo Canner 02/08/2017, 12:54 PM

## 2017-02-08 NOTE — Plan of Care (Signed)
Problem: Education: Goal: Knowledge of condition will improve Outcome: Completed/Met Date Met: 02/08/17 Discharge education reviewed with mother and father. Father of baby began to interpret. Language line called; however different dialect and patient states she understands information without interpreter. Father of baby began to raise his voice stating that the dialect was completely different and that he could interpret for mother. Informed mother and father that hospital policy is to use language line and that correct dialect could be used once stated. Mother states her aunt in room and her husband could assist her if needed; however staff insisted on using language line if possible and stated that family could not interpret. Mother states clearly in [place] that she can understand and doesn't need interpreter. Reviewed safety and discharge education; mother verbalizes understanding of information.

## 2017-02-08 NOTE — Progress Notes (Signed)
MOB was referred for history of depression/anxiety. * Referral screened out by Clinical Social Worker because none of the following criteria appear to apply: ~ History of anxiety/depression during this pregnancy, or of post-partum depression. ~ Diagnosis of anxiety and/or depression within last 3 years OR * MOB's symptoms currently being treated with medication and/or therapy.  CSW met with MOB at [facility] bedside for hx of depression.  MOB was polite and inviting.  MOB gave CSW permission to meet with MOB while FOB was present.  MOB denied hx of depression, however acknowledged a hx of anxiety over 3 years ago. CSW educated MOB about PPD. CSW informed MOB of possible supports and interventions to decrease PPD.  CSW also encouraged MOB to seek medical attention if needed for increased signs and symptoms for PPD. CSW provided MOB with a PPD checklist and support groups flyer offered by the hospital. No other psychosocial concerns were noted.  Laurey Arrow, MSW, LCSW Clinical Social Work [phone]

## 2017-06-18 ENCOUNTER — Encounter (HOSPITAL_COMMUNITY): Payer: Self-pay | Admitting: *Deleted

## 2017-06-18 ENCOUNTER — Inpatient Hospital Stay (HOSPITAL_COMMUNITY)
Admission: AD | Admit: 2017-06-18 | Discharge: 2017-06-18 | Disposition: A | Discharge status: Home / Self Care | Payer: Medicaid Other | Source: Ambulatory Visit | Attending: Obstetrics and Gynecology | Admitting: Obstetrics and Gynecology

## 2017-06-18 DIAGNOSIS — Z3202 Encounter for pregnancy test, result negative: Secondary | ICD-10-CM | POA: Diagnosis not present

## 2017-06-18 DIAGNOSIS — Z880 Allergy status to penicillin: Secondary | ICD-10-CM | POA: Diagnosis not present

## 2017-06-18 DIAGNOSIS — R1084 Generalized abdominal pain: Secondary | ICD-10-CM

## 2017-06-18 DIAGNOSIS — N926 Irregular menstruation, unspecified: Secondary | ICD-10-CM | POA: Diagnosis not present

## 2017-06-18 DIAGNOSIS — N939 Abnormal uterine and vaginal bleeding, unspecified: Secondary | ICD-10-CM | POA: Insufficient documentation

## 2017-06-18 LAB — URINALYSIS, ROUTINE W REFLEX MICROSCOPIC
Bilirubin Urine: NEGATIVE
GLUCOSE, UA: NEGATIVE mg/dL
Ketones, ur: NEGATIVE mg/dL
NITRITE: NEGATIVE
PROTEIN: NEGATIVE mg/dL
Specific Gravity, Urine: 1.001 — ABNORMAL LOW (ref 1.005–1.030)
Squamous Epithelial / LPF: NONE SEEN
pH: 6 (ref 5.0–8.0)

## 2017-06-18 LAB — POCT PREGNANCY, URINE: PREG TEST UR: NEGATIVE

## 2017-06-18 LAB — HCG, SERUM, QUALITATIVE: Preg, Serum: NEGATIVE

## 2017-06-18 NOTE — Discharge Instructions (Signed)
Pregnancy Test Information °What is a pregnancy test? °A pregnancy test is used to detect the presence of human chorionic gonadotropin (hCG) in a sample of your urine or blood. hCG is a hormone produced by the cells of the placenta. The placenta is the organ that forms to nourish and support a developing baby. °This test requires a sample of either blood or urine. A pregnancy test determines whether you are pregnant or not. °How are pregnancy tests done? °Pregnancy tests are done using a home pregnancy test or having a blood or urine test done at your health care provider's office. °Home pregnancy tests require a urine sample. °· Most kits use a plastic testing device with a strip of paper that indicates whether there is hCG in your urine. °· Follow the test instructions very carefully. °· After you urinate on the test stick, markings will appear to let you know whether you are pregnant. °· For best results, use your first urine of the morning. That is when the concentration of hCG is highest. ° °Having a blood test to check for pregnancy requires a sample of blood drawn from a vein in your hand or arm. Your health care provider will send your sample to a lab for testing. Results of a pregnancy test will be positive or negative. °Is one type of pregnancy test better than another? °In some cases, a blood test will return a positive result even if a urine test was negative because blood tests are more sensitive. This means blood tests can detect hCG earlier than home pregnancy tests. °How accurate are home pregnancy tests? °Both types of pregnancy tests are very accurate. °· A blood test is about 98% accurate. °· When you are far enough along in your pregnancy and when used correctly, home pregnancy tests are equally accurate. ° °Can anything interfere with home pregnancy test results °It is possible for certain conditions to cause an inaccurate test result (false positive or false negative). °· A false positive is a  positive test result when you are not pregnant. This can happen if you: °? Are taking certain medicines, including anticonvulsants or tranquilizers. °? Have certain proteins in your blood. °· A false negative is a negative test result when you are pregnant. This can happen if you: °? Took the test before there was enough hCG to detect. A pregnancy test will not be positive in most women until 3-4 weeks after conception. °? Drank a lot of liquid before the test. Diluted urine samples can sometimes give an inaccurate result. °? Take certain medicines, such as water pills (diuretics) or some antihistamines. ° °What should I do if I have a positive pregnancy test? °If you have a positive pregnancy test, schedule an appointment with your health care provider. You might need additional testing to confirm the pregnancy. In the meantime, begin taking a prenatal vitamin, stop smoking, stop drinking alcohol, and do not use street drugs. °Talk to your health care provider about how to take care of yourself during your pregnancy. Ask about what to expect from the care you will need throughout pregnancy (prenatal care). °This information is not intended to replace advice given to you by your health care provider. Make sure you discuss any questions you have with your health care provider. °Document Released: 11/15/2003 Document Revised: 10/09/2016 Document Reviewed: 03/09/2014 °Elsevier Interactive Patient Education © 2017 Elsevier Inc. ° °

## 2017-06-18 NOTE — MAU Note (Addendum)
Pregnant, this morning at 6 had pain in back and abd.  At 9 noted blood, pinkish, no clots. Has not been seen yet with preg.  +HPT

## 2017-06-18 NOTE — MAU Provider Note (Signed)
History     CSN: [id number]  Arrival date and time: 06/18/17 1134   First Provider Initiated Contact with Patient 06/18/17 1333      Chief Complaint  Patient presents with  . Vaginal Bleeding  . Abdominal Pain  . Possible Pregnancy   HPI   Ms.Katrina [name] is a 31 y.o. female G1P1001 here in MAU with vaginal bleeding and abdominal pain. She is concerned about pregnancy. On Friday June 20th she had a positive pregnancy test at home. She started bleeding today at 0900. The bleeding is light in flow and dark red. She denies dizziness. The pain feels like period cramps that she normally has on her cycle. States she is breast feeding exclusively.   Her LMP was June 9th, states that if this is her period today then it is a late period.    OB History    Gravida Para Term Preterm AB Living   1 1 1     1    SAB TAB Ectopic Multiple Live Births         0 1      Past Medical History:  Diagnosis Date  . Hypothyroidism     Past Surgical History:  Procedure Laterality Date  . NO PAST SURGERIES      History reviewed. No pertinent family history.  Social History  Substance Use Topics  . Smoking status: Never Smoker  . Smokeless tobacco: Never Used  . Alcohol use Not on file    Allergies:  Allergies  Allergen Reactions  . Penicillins Rash    Has patient had a PCN reaction causing immediate rash, facial/tongue/throat swelling, SOB or lightheadedness with hypotension: Unknown Has patient had a PCN reaction causing severe rash involving mucus membranes or skin necrosis: Unknown Has patient had a PCN reaction that required hospitalization Unknown Has patient had a PCN reaction occurring within the last 10 years: No If all of the above answers are "NO", then may proceed with Cephalosporin use.     Prescriptions Prior to Admission  Medication Sig Dispense Refill Last Dose  . levothyroxine (SYNTHROID, LEVOTHROID) 75 MCG tablet Take 75 mcg by mouth daily before breakfast.    06/17/2017 at Unknown time  . acetaminophen (TYLENOL) 500 MG tablet Take 2 tablets (1,000 mg total) by mouth every 6 (six) hours as needed. (Patient not taking: Reported on 06/18/2017) 30 tablet 5 Not Taking at Unknown time  . ibuprofen (ADVIL,MOTRIN) 800 MG tablet Take 1 tablet (800 mg total) by mouth 3 (three) times daily. (Patient not taking: Reported on 06/18/2017) 30 tablet 0 Not Taking at Unknown time   Results for orders placed or performed during the hospital encounter of 06/18/17 (from the past 48 hour(s))  Urinalysis, Routine w reflex microscopic     Status: Abnormal   Collection Time: 06/18/17 11:47 AM  Result Value Ref Range   Color, Urine YELLOW YELLOW   APPearance CLEAR CLEAR   Specific Gravity, Urine 1.001 (L) 1.005 - 1.030   pH 6.0 5.0 - 8.0   Glucose, UA NEGATIVE NEGATIVE mg/dL   Hgb urine dipstick LARGE (A) NEGATIVE   Bilirubin Urine NEGATIVE NEGATIVE   Ketones, ur NEGATIVE NEGATIVE mg/dL   Protein, ur NEGATIVE NEGATIVE mg/dL   Nitrite NEGATIVE NEGATIVE   Leukocytes, UA SMALL (A) NEGATIVE   RBC / HPF 0-5 0 - 5 RBC/hpf   WBC, UA 0-5 0 - 5 WBC/hpf   Bacteria, UA RARE (A) NONE SEEN   Squamous Epithelial / LPF NONE SEEN NONE SEEN  Pregnancy, urine POC     Status: None   Collection Time: 06/18/17 11:56 AM  Result Value Ref Range   Preg Test, Ur NEGATIVE NEGATIVE    Comment:        THE SENSITIVITY OF THIS METHODOLOGY IS >24 mIU/mL   hCG, serum, qualitative     Status: None   Collection Time: 06/18/17 12:19 PM  Result Value Ref Range   Preg, Serum NEGATIVE NEGATIVE    Comment:        THE SENSITIVITY OF THIS METHODOLOGY IS >10 mIU/mL.    Review of Systems  Constitutional: Negative for fever.  Gastrointestinal: Positive for abdominal pain.  Genitourinary: Positive for vaginal bleeding.  Neurological: Negative for dizziness and light-headedness.   Physical Exam   Blood pressure 111/73, pulse 94, temperature 98.4 F (36.9 C), temperature source Oral, resp. rate  20, weight 155 lb 12 oz (70.6 kg), last menstrual period 05/04/2017, SpO2 100 %, unknown if currently breastfeeding.  Physical Exam  Constitutional: She is oriented to person, place, and time. She appears well-developed and well-nourished. No distress.  HENT:  Head: Normocephalic.  Eyes: Pupils are equal, round, and reactive to light.  GI: Soft. She exhibits no distension and no mass. There is no tenderness. There is no rebound and no guarding.  Musculoskeletal: Normal range of motion.  Neurological: She is alert and oriented to person, place, and time.  Skin: Skin is warm. She is not diaphoretic.  Psychiatric: Her behavior is normal.   MAU Course  Procedures  None  MDM  Urine pregnancy test Qualitative Hcg  Patient reports positive pregnancy test at home; urine pregnancy test today in MAU negative. Qualitative Hcg collected and negative today.  Patient concerned about cost for today's visit, no further testing indicated or warranted. Vaginal cultures not collected at visit.  Discussed patient with Dr. Tenny Crawoss. Ok to discharge home.   Assessment and Plan   A:  1. Encounter for pregnancy test with result negative   2. Generalized abdominal cramping   3. Abnormal menstrual cycle     P:  Discharge home in stable condition Ok to take ibuprofen at home for cramping as directed on the bottle.  Return to MAU if symptoms worsen  Follow up with Dr. Tenny Crawoss if needed  Venia Carbonasch, Luismiguel Lamere I, NP 06/18/2017 2:43 PM

## 2017-11-26 NOTE — L&D Delivery Note (Signed)
Patient was C/C/+2 and pushed for 1 minutes with epidural.    NSVD  female infant, Apgars 8,9, weight P.   The patient had a midline second degree perineal laceration repaired with 2-0 vicryl R. Fundus was firm. EBL was expected amount. Placenta was delivered intact. Vagina was clear.  Delayed cord clamping done for 30-60 seconds while warming baby. Baby was vigorous and doing skin to skin with mother.  Exie Chrismer A

## 2018-02-25 LAB — OB RESULTS CONSOLE ABO/RH: RH TYPE: POSITIVE

## 2018-02-25 LAB — OB RESULTS CONSOLE GC/CHLAMYDIA
CHLAMYDIA, DNA PROBE: NEGATIVE
Gonorrhea: NEGATIVE

## 2018-02-25 LAB — OB RESULTS CONSOLE HEPATITIS B SURFACE ANTIGEN: HEP B S AG: NEGATIVE

## 2018-02-25 LAB — OB RESULTS CONSOLE ANTIBODY SCREEN: ANTIBODY SCREEN: NEGATIVE

## 2018-02-25 LAB — OB RESULTS CONSOLE RUBELLA ANTIBODY, IGM: Rubella: IMMUNE

## 2018-02-25 LAB — OB RESULTS CONSOLE HIV ANTIBODY (ROUTINE TESTING): HIV: NONREACTIVE

## 2018-02-25 LAB — OB RESULTS CONSOLE RPR: RPR: NONREACTIVE

## 2018-05-04 ENCOUNTER — Inpatient Hospital Stay (HOSPITAL_COMMUNITY)
Admission: AD | Admit: 2018-05-04 | Discharge: 2018-05-04 | Disposition: A | Discharge status: Home / Self Care | Payer: Medicaid Other | Source: Ambulatory Visit | Attending: Obstetrics and Gynecology | Admitting: Obstetrics and Gynecology

## 2018-05-04 ENCOUNTER — Other Ambulatory Visit: Payer: Self-pay

## 2018-05-04 ENCOUNTER — Encounter (HOSPITAL_COMMUNITY): Payer: Self-pay | Admitting: *Deleted

## 2018-05-04 DIAGNOSIS — O26892 Other specified pregnancy related conditions, second trimester: Secondary | ICD-10-CM | POA: Diagnosis not present

## 2018-05-04 DIAGNOSIS — Z3A17 17 weeks gestation of pregnancy: Secondary | ICD-10-CM | POA: Diagnosis not present

## 2018-05-04 DIAGNOSIS — N949 Unspecified condition associated with female genital organs and menstrual cycle: Secondary | ICD-10-CM

## 2018-05-04 DIAGNOSIS — Z880 Allergy status to penicillin: Secondary | ICD-10-CM | POA: Insufficient documentation

## 2018-05-04 DIAGNOSIS — R102 Pelvic and perineal pain: Secondary | ICD-10-CM | POA: Insufficient documentation

## 2018-05-04 LAB — URINALYSIS, ROUTINE W REFLEX MICROSCOPIC
Bacteria, UA: NONE SEEN
Bilirubin Urine: NEGATIVE
Glucose, UA: NEGATIVE mg/dL
KETONES UR: NEGATIVE mg/dL
LEUKOCYTES UA: NEGATIVE
NITRITE: NEGATIVE
PROTEIN: NEGATIVE mg/dL
SPECIFIC GRAVITY, URINE: 1.01 (ref 1.005–1.030)
pH: 6 (ref 5.0–8.0)

## 2018-05-04 LAB — WET PREP, GENITAL
Clue Cells Wet Prep HPF POC: NONE SEEN
SPERM: NONE SEEN
TRICH WET PREP: NONE SEEN
Yeast Wet Prep HPF POC: NONE SEEN
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
## 2018-05-04 NOTE — Discharge Instructions (Signed)

## 2018-05-05 LAB — GC/CHLAMYDIA PROBE AMP (~~LOC~~) NOT AT ARMC
Chlamydia: NEGATIVE
Neisseria Gonorrhea: NEGATIVE

## 2018-08-25 ENCOUNTER — Inpatient Hospital Stay (HOSPITAL_COMMUNITY)
Admission: AD | Admit: 2018-08-25 | Discharge: 2018-08-25 | Disposition: A | Discharge status: Home / Self Care | Payer: Medicaid Other | Attending: Obstetrics and Gynecology | Admitting: Obstetrics and Gynecology

## 2018-08-25 ENCOUNTER — Encounter (HOSPITAL_COMMUNITY): Payer: Self-pay | Admitting: *Deleted

## 2018-08-25 ENCOUNTER — Other Ambulatory Visit: Payer: Self-pay

## 2018-08-25 DIAGNOSIS — K831 Obstruction of bile duct: Secondary | ICD-10-CM

## 2018-08-25 DIAGNOSIS — Z880 Allergy status to penicillin: Secondary | ICD-10-CM | POA: Insufficient documentation

## 2018-08-25 DIAGNOSIS — O368130 Decreased fetal movements, third trimester, not applicable or unspecified: Secondary | ICD-10-CM | POA: Insufficient documentation

## 2018-08-25 DIAGNOSIS — O4703 False labor before 37 completed weeks of gestation, third trimester: Secondary | ICD-10-CM | POA: Insufficient documentation

## 2018-08-25 DIAGNOSIS — O26613 Liver and biliary tract disorders in pregnancy, third trimester: Secondary | ICD-10-CM

## 2018-08-25 DIAGNOSIS — Z0371 Encounter for suspected problem with amniotic cavity and membrane ruled out: Secondary | ICD-10-CM | POA: Diagnosis not present

## 2018-08-25 DIAGNOSIS — O479 False labor, unspecified: Secondary | ICD-10-CM

## 2018-08-25 DIAGNOSIS — Z3A33 33 weeks gestation of pregnancy: Secondary | ICD-10-CM | POA: Diagnosis not present

## 2018-08-25 HISTORY — DX: Obstruction of bile duct: K83.1

## 2018-08-25 LAB — URINALYSIS, ROUTINE W REFLEX MICROSCOPIC
Bilirubin Urine: NEGATIVE
Glucose, UA: NEGATIVE mg/dL
Hgb urine dipstick: NEGATIVE
Ketones, ur: NEGATIVE mg/dL
Leukocytes, UA: NEGATIVE
Nitrite: NEGATIVE
Protein, ur: NEGATIVE mg/dL
Specific Gravity, Urine: 1.02 (ref 1.005–1.030)
pH: 6 (ref 5.0–8.0)

## 2018-08-25 LAB — POCT FERN TEST: POCT Fern Test: NEGATIVE

## 2018-08-25 MED ORDER — NIFEDIPINE 10 MG PO CAPS
10.0000 mg | ORAL_CAPSULE | ORAL | Status: DC | PRN
  Administered 2018-08-25 (×2): 10 mg via ORAL
  Filled 2018-08-25 (×2): qty 1

## 2018-08-25 NOTE — MAU Provider Note (Signed)
History     CSN: [id number]  Arrival date and time: 08/25/18 1037   First Provider Initiated Contact with Patient 08/25/18 1116      Chief Complaint  Patient presents with  . Decreased Fetal Movement  . Rupture of Membranes   Katrina [name] is a 32 y.o. G2P1 at [redacted]w[redacted]d who presents to MAU with complaints of decreased fetal movement and possible rupture of membranes. She reports not having fetal movement since early this morning around 0200- she reports feeling movement as usual since being in MAU and placed on monitors. She reports abdominal pain that has been occurring for the past week, describes the abdominal pain as intermittent lower abdominal cramping, rates pain 4/10- has not taken any medication for abdominal pain. Reports pain radiates down to her pelvis and vagina as pressure. Reports pressure being worse over the past 3 days. Vaginal discharge has been increasing since last night, describes the vaginal discharge as white thin discharge with no odor. She denies vaginal bleeding, reports recent intercourse last night prior to symptoms occurring but is unsure whether it is discharge or LOF. Pregnancy is complicated by Cholestasis.    OB History    Gravida  2   Para  1   Term  1   Preterm      AB      Living  1     SAB      TAB      Ectopic      Multiple  0   Live Births  1           Past Medical History:  Diagnosis Date  . Cholestasis   . Hypothyroidism     Past Surgical History:  Procedure Laterality Date  . NO PAST SURGERIES      No family history on file.  Social History   Tobacco Use  . Smoking status: Never Smoker  . Smokeless tobacco: Never Used  Substance Use Topics  . Alcohol use: Never    Frequency: Never  . Drug use: No    Allergies:  Allergies  Allergen Reactions  . Penicillins Rash    Has patient had a PCN reaction causing immediate rash, facial/tongue/throat swelling, SOB or lightheadedness with hypotension: Unknown Has  patient had a PCN reaction causing severe rash involving mucus membranes or skin necrosis: Unknown Has patient had a PCN reaction that required hospitalization Unknown Has patient had a PCN reaction occurring within the last 10 years: No If all of the above answers are "NO", then may proceed with Cephalosporin use.     Medications Prior to Admission  Medication Sig Dispense Refill Last Dose  . ferrous sulfate 325 (65 FE) MG tablet Take 325 mg by mouth daily with breakfast.   08/24/2018 at Unknown time  . levothyroxine (SYNTHROID, LEVOTHROID) 100 MCG tablet Take 100 mcg by mouth daily before breakfast.   08/25/2018 at Unknown time  . Prenatal Vit-Fe Fumarate-FA (MULTIVITAMIN-PRENATAL) 27-0.8 MG TABS tablet Take 1 tablet by mouth at bedtime.    08/24/2018 at Unknown time  . ranitidine (ZANTAC) 150 MG tablet Take 150 mg by mouth 2 (two) times daily.   08/24/2018 at Unknown time  . ursodiol (ACTIGALL) 250 MG tablet ursodiol 250 mg tablet  TK 1 T PO QAM AND 2 TS QPM   08/25/2018 at Unknown time    Review of Systems  Constitutional: Negative.   Respiratory: Negative.   Cardiovascular: Negative.   Gastrointestinal: Positive for abdominal pain. Negative for constipation, diarrhea, nausea  and vomiting.  Genitourinary: Positive for vaginal discharge. Negative for difficulty urinating, dysuria, frequency, pelvic pain, urgency and vaginal bleeding.  Neurological: Negative.    Physical Exam   Blood pressure 108/63, pulse 92, temperature 97.7 F (36.5 C), temperature source Oral, resp. rate 18, last menstrual period 01/06/2018, unknown if currently breastfeeding.  Physical Exam  Nursing note and vitals reviewed. Constitutional: She is oriented to person, place, and time. She appears well-developed and well-nourished. No distress.  Cardiovascular: Normal rate, regular rhythm and normal heart sounds.  Respiratory: Effort normal and breath sounds normal. No respiratory distress. She has no wheezes.  GI:  Soft. There is no tenderness. There is no rebound.  Gravid appropriate for gestational age, mild contractions palpated  Musculoskeletal: Normal range of motion. She exhibits no edema.  Neurological: She is alert and oriented to person, place, and time. She displays normal reflexes. She exhibits normal muscle tone.  Psychiatric: She has a normal mood and affect. Her behavior is normal. Thought content normal.   PELVIC: Normal external female genitalia. Vagina is pink and rugated. Cervix with normal contour, no lesions. Normal discharge.  negative pooling.  Dilation: 1 Effacement (%): Thick Cervical Position: Posterior Station: Ballotable Exam by:: Lanice Shirts CNM  FHR: 140/ moderate variability/ +accels/ no decels  Toco: occasional mild contractions   MAU Course  Procedures  MDM Orders Placed This Encounter  Procedures  . Urinalysis, Routine w reflex microscopic  . Fern Test   NST reactive  Fern negative  UA- negative  Unable to collect FFN due to recent IC within 24 hours  Treatments in MAU included Hydration and procardia 10mg  x2 for UC. Patient reports ease of abdominal cramping and pain prior to discharge home. Patient reports +FM.   Educated and discussed reasons to return to MAU for evaluation. Follow up as scheduled for prenatal appointments. Pt stable at time of discharge.   Assessment and Plan   1. Decreased fetal movements in third trimester, single or unspecified fetus   2. Braxton Hicks contractions   3. No leakage of amniotic fluid into vagina    Discharge home  Return to MAU as needed for evaluation Follow up as scheduled for prenatal appointments  Continue medication as prescribed   Follow-up Information    Ob/Gyn, [facility] Follow up.   Why:  follow up as scheduled for prenatal appointments and return to MAU as needed Contact information: [address] [place] [address] [phone]          Allergies as of 08/25/2018       Reactions   Penicillins Rash   Has patient had a PCN reaction causing immediate rash, facial/tongue/throat swelling, SOB or lightheadedness with hypotension: Unknown Has patient had a PCN reaction causing severe rash involving mucus membranes or skin necrosis: Unknown Has patient had a PCN reaction that required hospitalization Unknown Has patient had a PCN reaction occurring within the last 10 years: No If all of the above answers are "NO", then may proceed with Cephalosporin use.      Medication List    TAKE these medications   ferrous sulfate 325 (65 FE) MG tablet Take 325 mg by mouth daily with breakfast.   levothyroxine 100 MCG tablet Commonly known as:  SYNTHROID, LEVOTHROID Take 100 mcg by mouth daily before breakfast.   multivitamin-prenatal 27-0.8 MG Tabs tablet Take 1 tablet by mouth at bedtime.   ranitidine 150 MG tablet Commonly known as:  ZANTAC Take 150 mg by mouth 2 (  two) times daily.   ursodiol 250 MG tablet Commonly known as:  ACTIGALL ursodiol 250 mg tablet  TK 1 T PO QAM AND 2 TS QPM      Sharyon Cable 08/25/2018, 12:56 PM

## 2018-08-25 NOTE — MAU Note (Addendum)
Pt C/O no fetal movement since 0200, also lower abd pain that comes & goes for the past week, vaginal pain & pressure for the last 3 nights.  Denies bleeding, has some vaginal discharge, unsure if leaking fluid.

## 2018-08-25 NOTE — Discharge Instructions (Signed)

## 2018-08-25 NOTE — MAU Note (Signed)
Urine sent to lab 

## 2018-09-15 LAB — OB RESULTS CONSOLE GBS: GBS: NEGATIVE

## 2018-09-18 ENCOUNTER — Other Ambulatory Visit: Payer: Self-pay | Admitting: Obstetrics and Gynecology

## 2018-09-18 ENCOUNTER — Telehealth (HOSPITAL_COMMUNITY): Payer: Self-pay | Admitting: *Deleted

## 2018-09-18 ENCOUNTER — Encounter (HOSPITAL_COMMUNITY): Payer: Self-pay | Admitting: *Deleted

## 2018-09-18 NOTE — Telephone Encounter (Signed)
Preadmission screen  

## 2018-09-23 ENCOUNTER — Inpatient Hospital Stay (HOSPITAL_COMMUNITY): Payer: Medicaid Other | Admitting: Anesthesiology

## 2018-09-23 ENCOUNTER — Inpatient Hospital Stay (HOSPITAL_COMMUNITY)
Admission: AD | Admit: 2018-09-23 | Discharge: 2018-09-25 | DRG: 805 | Disposition: A | Discharge status: Home / Self Care | Payer: Medicaid Other | Attending: Obstetrics and Gynecology | Admitting: Obstetrics and Gynecology

## 2018-09-23 ENCOUNTER — Encounter (HOSPITAL_COMMUNITY): Payer: Self-pay | Admitting: *Deleted

## 2018-09-23 DIAGNOSIS — O26643 Intrahepatic cholestasis of pregnancy, third trimester: Secondary | ICD-10-CM

## 2018-09-23 DIAGNOSIS — K831 Obstruction of bile duct: Secondary | ICD-10-CM | POA: Diagnosis present

## 2018-09-23 DIAGNOSIS — Z3A37 37 weeks gestation of pregnancy: Secondary | ICD-10-CM

## 2018-09-23 DIAGNOSIS — O2662 Liver and biliary tract disorders in childbirth: Principal | ICD-10-CM | POA: Diagnosis present

## 2018-09-23 DIAGNOSIS — O26613 Liver and biliary tract disorders in pregnancy, third trimester: Secondary | ICD-10-CM

## 2018-09-23 LAB — CBC
HCT: 30.1 % — ABNORMAL LOW (ref 36.0–46.0)
HEMOGLOBIN: 10.3 g/dL — AB (ref 12.0–15.0)
MCH: 29.6 pg (ref 26.0–34.0)
MCHC: 34.2 g/dL (ref 30.0–36.0)
MCV: 86.5 fL (ref 80.0–100.0)
Platelets: 369 10*3/uL (ref 150–400)
RBC: 3.48 MIL/uL — AB (ref 3.87–5.11)
RDW: 13.6 % (ref 11.5–15.5)
WBC: 9.7 10*3/uL (ref 4.0–10.5)
nRBC: 0 % (ref 0.0–0.2)

## 2018-09-23 LAB — TYPE AND SCREEN
ABO/RH(D): B POS
Antibody Screen: NEGATIVE

## 2018-09-23 MED ORDER — FENTANYL 2.5 MCG/ML BUPIVACAINE 1/10 % EPIDURAL INFUSION (WH - ANES)
14.0000 mL/h | INTRAMUSCULAR | Status: DC | PRN
  Administered 2018-09-23: 14 mL/h via EPIDURAL
  Filled 2018-09-23: qty 100

## 2018-09-23 MED ORDER — TETANUS-DIPHTH-ACELL PERTUSSIS 5-2.5-18.5 LF-MCG/0.5 IM SUSP: 0.5000 mL | Freq: Once | INTRAMUSCULAR | Status: DC

## 2018-09-23 MED ORDER — PHENYLEPHRINE 40 MCG/ML (10ML) SYRINGE FOR IV PUSH (FOR BLOOD PRESSURE SUPPORT)
80.0000 ug | PREFILLED_SYRINGE | INTRAVENOUS | Status: DC | PRN
  Filled 2018-09-23: qty 5
  Filled 2018-09-23: qty 10

## 2018-09-23 MED ORDER — FLEET ENEMA 7-19 GM/118ML RE ENEM: 1.0000 | ENEMA | Freq: Every day | RECTAL | Status: DC | PRN

## 2018-09-23 MED ORDER — LACTATED RINGERS IV SOLN
INTRAVENOUS | Status: DC
  Administered 2018-09-23 (×2): via INTRAVENOUS

## 2018-09-23 MED ORDER — SODIUM CHLORIDE 0.9 % IV SOLN: 250.0000 mL | INTRAVENOUS | Status: DC | PRN

## 2018-09-23 MED ORDER — LACTATED RINGERS IV SOLN
500.0000 mL | INTRAVENOUS | Status: DC | PRN
  Administered 2018-09-23: 1000 mL via INTRAVENOUS

## 2018-09-23 MED ORDER — LACTATED RINGERS AMNIOINFUSION
INTRAVENOUS | Status: DC
  Administered 2018-09-23: 20:00:00 via INTRAUTERINE
  Filled 2018-09-23: qty 1000

## 2018-09-23 MED ORDER — EPHEDRINE 5 MG/ML INJ
10.0000 mg | INTRAVENOUS | Status: DC | PRN
  Filled 2018-09-23: qty 2

## 2018-09-23 MED ORDER — BENZOCAINE-MENTHOL 20-0.5 % EX AERO
1.0000 "application " | INHALATION_SPRAY | CUTANEOUS | Status: DC | PRN
  Administered 2018-09-24: 1 via TOPICAL
  Filled 2018-09-23: qty 56

## 2018-09-23 MED ORDER — TERBUTALINE SULFATE 1 MG/ML IJ SOLN
0.2500 mg | Freq: Once | INTRAMUSCULAR | Status: DC | PRN
  Filled 2018-09-23: qty 1

## 2018-09-23 MED ORDER — OXYCODONE-ACETAMINOPHEN 5-325 MG PO TABS: 2.0000 | ORAL_TABLET | ORAL | Status: DC | PRN

## 2018-09-23 MED ORDER — MAGNESIUM HYDROXIDE 400 MG/5ML PO SUSP: 30.0000 mL | ORAL | Status: DC | PRN

## 2018-09-23 MED ORDER — WITCH HAZEL-GLYCERIN EX PADS: 1.0000 "application " | MEDICATED_PAD | CUTANEOUS | Status: DC | PRN

## 2018-09-23 MED ORDER — OXYTOCIN 40 UNITS IN LACTATED RINGERS INFUSION - SIMPLE MED
2.5000 [IU]/h | INTRAVENOUS | Status: DC
  Administered 2018-09-23: 2.5 [IU]/h via INTRAVENOUS

## 2018-09-23 MED ORDER — OXYTOCIN 40 UNITS IN LACTATED RINGERS INFUSION - SIMPLE MED
1.0000 m[IU]/min | INTRAVENOUS | Status: DC
  Administered 2018-09-23: 2 m[IU]/min via INTRAVENOUS
  Filled 2018-09-23: qty 1000

## 2018-09-23 MED ORDER — ONDANSETRON HCL 4 MG/2ML IJ SOLN: 4.0000 mg | INTRAMUSCULAR | Status: DC | PRN

## 2018-09-23 MED ORDER — DIBUCAINE 1 % RE OINT: 1.0000 "application " | TOPICAL_OINTMENT | RECTAL | Status: DC | PRN

## 2018-09-23 MED ORDER — OXYCODONE-ACETAMINOPHEN 5-325 MG PO TABS
2.0000 | ORAL_TABLET | ORAL | Status: DC | PRN
  Administered 2018-09-24: 2 via ORAL
  Filled 2018-09-23: qty 2

## 2018-09-23 MED ORDER — LACTATED RINGERS IV SOLN
500.0000 mL | Freq: Once | INTRAVENOUS | Status: AC
  Administered 2018-09-23: 500 mL via INTRAVENOUS

## 2018-09-23 MED ORDER — ACETAMINOPHEN 325 MG PO TABS: 650.0000 mg | ORAL_TABLET | ORAL | Status: DC | PRN

## 2018-09-23 MED ORDER — DIPHENHYDRAMINE HCL 50 MG/ML IJ SOLN: 12.5000 mg | INTRAMUSCULAR | Status: DC | PRN

## 2018-09-23 MED ORDER — OXYTOCIN BOLUS FROM INFUSION
500.0000 mL | Freq: Once | INTRAVENOUS | Status: AC
  Administered 2018-09-23: 500 mL via INTRAVENOUS

## 2018-09-23 MED ORDER — METHYLERGONOVINE MALEATE 0.2 MG/ML IJ SOLN: 0.2000 mg | INTRAMUSCULAR | Status: DC | PRN

## 2018-09-23 MED ORDER — SODIUM CHLORIDE 0.9% FLUSH: 3.0000 mL | Freq: Two times a day (BID) | INTRAVENOUS | Status: DC

## 2018-09-23 MED ORDER — METHYLERGONOVINE MALEATE 0.2 MG PO TABS: 0.2000 mg | ORAL_TABLET | ORAL | Status: DC | PRN

## 2018-09-23 MED ORDER — ZOLPIDEM TARTRATE 5 MG PO TABS: 5.0000 mg | ORAL_TABLET | Freq: Every evening | ORAL | Status: DC | PRN

## 2018-09-23 MED ORDER — LIDOCAINE HCL (PF) 1 % IJ SOLN
30.0000 mL | INTRAMUSCULAR | Status: DC | PRN
  Filled 2018-09-23: qty 30

## 2018-09-23 MED ORDER — OXYCODONE-ACETAMINOPHEN 5-325 MG PO TABS: 1.0000 | ORAL_TABLET | ORAL | Status: DC | PRN

## 2018-09-23 MED ORDER — DIPHENHYDRAMINE HCL 25 MG PO CAPS: 25.0000 mg | ORAL_CAPSULE | Freq: Four times a day (QID) | ORAL | Status: DC | PRN

## 2018-09-23 MED ORDER — SODIUM CHLORIDE 0.9% FLUSH: 3.0000 mL | INTRAVENOUS | Status: DC | PRN

## 2018-09-23 MED ORDER — ONDANSETRON HCL 4 MG/2ML IJ SOLN: 4.0000 mg | Freq: Four times a day (QID) | INTRAMUSCULAR | Status: DC | PRN

## 2018-09-23 MED ORDER — SENNOSIDES-DOCUSATE SODIUM 8.6-50 MG PO TABS
2.0000 | ORAL_TABLET | ORAL | Status: DC
  Administered 2018-09-24: 2 via ORAL
  Filled 2018-09-23: qty 2

## 2018-09-23 MED ORDER — LEVOTHYROXINE SODIUM 100 MCG PO TABS
100.0000 ug | ORAL_TABLET | Freq: Every day | ORAL | Status: DC
  Administered 2018-09-24 – 2018-09-25 (×2): 100 ug via ORAL
  Filled 2018-09-23 (×2): qty 1

## 2018-09-23 MED ORDER — IBUPROFEN 800 MG PO TABS
800.0000 mg | ORAL_TABLET | Freq: Three times a day (TID) | ORAL | Status: DC
  Administered 2018-09-24 – 2018-09-25 (×5): 800 mg via ORAL
  Filled 2018-09-23 (×5): qty 1

## 2018-09-23 MED ORDER — ACETAMINOPHEN 325 MG PO TABS
650.0000 mg | ORAL_TABLET | ORAL | Status: DC | PRN
  Administered 2018-09-24 (×3): 650 mg via ORAL
  Filled 2018-09-23 (×3): qty 2

## 2018-09-23 MED ORDER — URSODIOL 250 MG PO TABS
500.0000 mg | ORAL_TABLET | Freq: Two times a day (BID) | ORAL | Status: DC
  Administered 2018-09-24 – 2018-09-25 (×3): 500 mg via ORAL
  Filled 2018-09-23: qty 2

## 2018-09-23 MED ORDER — PRENATAL MULTIVITAMIN CH
1.0000 | ORAL_TABLET | Freq: Every day | ORAL | Status: DC
  Administered 2018-09-24 – 2018-09-25 (×2): 1 via ORAL
  Filled 2018-09-23 (×2): qty 1

## 2018-09-23 MED ORDER — SIMETHICONE 80 MG PO CHEW: 80.0000 mg | CHEWABLE_TABLET | ORAL | Status: DC | PRN

## 2018-09-23 MED ORDER — ONDANSETRON HCL 4 MG PO TABS: 4.0000 mg | ORAL_TABLET | ORAL | Status: DC | PRN

## 2018-09-23 MED ORDER — COCONUT OIL OIL: 1.0000 "application " | TOPICAL_OIL | Status: DC | PRN

## 2018-09-23 MED ORDER — FAMOTIDINE 20 MG PO TABS
40.0000 mg | ORAL_TABLET | Freq: Every day | ORAL | Status: DC
  Administered 2018-09-24 – 2018-09-25 (×2): 40 mg via ORAL
  Filled 2018-09-23 (×2): qty 2

## 2018-09-23 MED ORDER — SOD CITRATE-CITRIC ACID 500-334 MG/5ML PO SOLN: 30.0000 mL | ORAL | Status: DC | PRN

## 2018-09-23 MED ORDER — PHENYLEPHRINE 40 MCG/ML (10ML) SYRINGE FOR IV PUSH (FOR BLOOD PRESSURE SUPPORT)
80.0000 ug | PREFILLED_SYRINGE | INTRAVENOUS | Status: DC | PRN
  Filled 2018-09-23: qty 5

## 2018-09-23 MED ORDER — LIDOCAINE HCL (PF) 1 % IJ SOLN
INTRAMUSCULAR | Status: DC | PRN
  Administered 2018-09-23 (×2): 6 mL via EPIDURAL

## 2018-09-23 MED ORDER — MEASLES, MUMPS & RUBELLA VAC ~~LOC~~ INJ: 0.5000 mL | INJECTION | Freq: Once | SUBCUTANEOUS | Status: DC

## 2018-09-23 MED ORDER — FERROUS SULFATE 325 (65 FE) MG PO TABS
325.0000 mg | ORAL_TABLET | Freq: Two times a day (BID) | ORAL | Status: DC
  Administered 2018-09-24 – 2018-09-25 (×3): 325 mg via ORAL
  Filled 2018-09-23 (×3): qty 1

## 2018-09-23 NOTE — Anesthesia Pain Management Evaluation Note (Signed)
  CRNA Pain Management Visit Note  Patient: Katrina [name], 32 y.o., female  "Hello I am a member of the anesthesia team at [facility]. We have an anesthesia team available at all times to provide care throughout the hospital, including epidural management and anesthesia for C-section. I don't know your plan for the delivery whether it a natural birth, water birth, IV sedation, nitrous supplementation, doula or epidural, but we want to meet your pain goals."   1.Was your pain managed to your expectations on prior hospitalizations?   Yes   2.What is your expectation for pain management during this hospitalization?     Epidural  3.How can we help you reach that goal? **support*  Record the patient's initial score and the patient's pain goal.   Pain: 5  Pain Goal: 7 [facility] wants you to be able to say your pain was always managed very well.  Trellis Paganini 09/23/2018

## 2018-09-23 NOTE — Anesthesia Preprocedure Evaluation (Signed)
Anesthesia Evaluation  Patient identified by MRN, date of birth, ID band Patient awake    Reviewed: Allergy & Precautions, H&P , NPO status , Patient's Chart, lab work & pertinent test results  Airway Mallampati: II  TM Distance: >3 FB Neck ROM: full    Dental no notable dental hx. (+) Teeth Intact   Pulmonary neg pulmonary ROS,    Pulmonary exam normal breath sounds clear to auscultation       Cardiovascular negative cardio ROS Normal cardiovascular exam Rhythm:regular Rate:Normal     Neuro/Psych negative neurological ROS  negative psych ROS   GI/Hepatic negative GI ROS, Neg liver ROS,   Endo/Other  negative endocrine ROS  Renal/GU negative Renal ROS     Musculoskeletal   Abdominal Normal abdominal exam  (+)   Peds  Hematology negative hematology ROS (+)   Anesthesia Other Findings   Reproductive/Obstetrics (+) Pregnancy                             Anesthesia Physical Anesthesia Plan  ASA: II  Anesthesia Plan: Epidural   Post-op Pain Management:    Induction:   PONV Risk Score and Plan:   Airway Management Planned:   Additional Equipment:   Intra-op Plan:   Post-operative Plan:   Informed Consent: I have reviewed the patients History and Physical, chart, labs and discussed the procedure including the risks, benefits and alternatives for the proposed anesthesia with the patient or authorized representative who has indicated his/her understanding and acceptance.       Plan Discussed with:   Anesthesia Plan Comments:         Anesthesia Quick Evaluation  

## 2018-09-23 NOTE — H&P (Signed)
32 y.o. [redacted]w[redacted]d  G2P1001 comes in for induction for cholestasis of pregnancy.  Otherwise has good fetal movement and no bleeding.  Past Medical History:  Diagnosis Date  . Cholestasis   . Depression   . Hypothyroidism     Past Surgical History:  Procedure Laterality Date  . NO PAST SURGERIES      OB History  Gravida Para Term Preterm AB Living  2 1 1     1   SAB TAB Ectopic Multiple Live Births        0 1    # Outcome Date GA Lbr Len/2nd Weight Sex Delivery Anes PTL Lv  2 Current           1 Term 02/06/17 [redacted]w[redacted]d 11:31 / 01:57 2909 g F Vag-Spont EPI  LIV    Social History   Socioeconomic History  . Marital status: Married    Spouse name: Not on file  . Number of children: Not on file  . Years of education: Not on file  . Highest education level: Not on file  Occupational History  . Not on file  Social Needs  . Financial resource strain: Not on file  . Food insecurity:    Worry: Not on file    Inability: Not on file  . Transportation needs:    Medical: Not on file    Non-medical: Not on file  Tobacco Use  . Smoking status: Never Smoker  . Smokeless tobacco: Never Used  Substance and Sexual Activity  . Alcohol use: Never    Frequency: Never  . Drug use: No  . Sexual activity: Not on file  Lifestyle  . Physical activity:    Days per week: Not on file    Minutes per session: Not on file  . Stress: Not on file  Relationships  . Social connections:    Talks on phone: Not on file    Gets together: Not on file    Attends religious service: Not on file    Active member of club or organization: Not on file    Attends meetings of clubs or organizations: Not on file    Relationship status: Not on file  . Intimate partner violence:    Fear of current or ex partner: Not on file    Emotionally abused: Not on file    Physically abused: Not on file    Forced sexual activity: Not on file  Other Topics Concern  . Not on file  Social History Narrative  . Not on file   Penicillins    Prenatal Transfer Tool  Maternal Diabetes: No Genetic Screening: Declined Maternal Ultrasounds/Referrals: Normal Fetal Ultrasounds or other Referrals:  None Maternal Substance Abuse:  No Significant Maternal Medications:  Meds include: Other: Ursadiol Significant Maternal Lab Results: Lab values include: Group B Strep negative, elevated bile acids  Other PNC: uncomplicated.    Vitals:   09/23/18 0935  BP: 108/60  Pulse: 86  Resp: 16  Temp: 98 F (36.7 C)  TempSrc: Oral    Lungs/Cor:  NAD Abdomen:  soft, gravid Ex:  no cords, erythema SVE:  2/50/-2 per nurse. FHTs:  140s, good STV, NST R; Cat 1 tracing. Toco:  q occ   A/P   Term induction for cholestasis of pregnancy.  GBS neg  Mylin Gignac A

## 2018-09-23 NOTE — Progress Notes (Signed)
Pt getting uncomfortable.  FHTs NST R, cat 1.  Toco not tracing well. SVE 3/50/-2, AROM clear.  Continue induction.

## 2018-09-23 NOTE — Anesthesia Procedure Notes (Signed)
Epidural Patient location during procedure: OB Start time: 09/23/2018 4:48 PM End time: 09/23/2018 4:51 PM  Staffing Anesthesiologist: Leilani Able, MD Performed: anesthesiologist   Preanesthetic Checklist Completed: patient identified, site marked, surgical consent, pre-op evaluation, timeout performed, IV checked, risks and benefits discussed and monitors and equipment checked  Epidural Patient position: sitting Prep: site prepped and draped and DuraPrep Patient monitoring: continuous pulse ox and blood pressure Approach: midline Location: L3-L4 Injection technique: LOR air  Needle:  Needle type: Tuohy  Needle gauge: 17 G Needle length: 9 cm and 9 Needle insertion depth: 5 cm cm Catheter type: closed end flexible Catheter size: 19 Gauge Catheter at skin depth: 10 cm Test dose: negative and Other  Assessment Sensory level: T9 Events: blood not aspirated, injection not painful, no injection resistance, negative IV test and no paresthesia  Additional Notes Reason for block:procedure for pain

## 2018-09-24 ENCOUNTER — Inpatient Hospital Stay (HOSPITAL_COMMUNITY): Admission: RE | Admit: 2018-09-24 | Payer: Medicaid Other | Source: Ambulatory Visit

## 2018-09-24 LAB — CBC
HCT: 28.8 % — ABNORMAL LOW (ref 36.0–46.0)
Hemoglobin: 9.8 g/dL — ABNORMAL LOW (ref 12.0–15.0)
MCH: 29.4 pg (ref 26.0–34.0)
MCHC: 34 g/dL (ref 30.0–36.0)
MCV: 86.5 fL (ref 80.0–100.0)
Platelets: 343 10*3/uL (ref 150–400)
RBC: 3.33 MIL/uL — AB (ref 3.87–5.11)
RDW: 13.6 % (ref 11.5–15.5)
WBC: 12 10*3/uL — ABNORMAL HIGH (ref 4.0–10.5)
nRBC: 0 % (ref 0.0–0.2)

## 2018-09-24 LAB — RPR: RPR Ser Ql: NONREACTIVE

## 2018-09-24 NOTE — Anesthesia Postprocedure Evaluation (Signed)
Anesthesia Post Note  Patient: Financial controller  Procedure(s) Performed: AN AD HOC LABOR EPIDURAL     Patient location during evaluation: Mother Baby Anesthesia Type: Epidural Level of consciousness: awake, awake and alert and oriented Pain management: pain level controlled Vital Signs Assessment: post-procedure vital signs reviewed and stable Respiratory status: spontaneous breathing, nonlabored ventilation and respiratory function stable Cardiovascular status: stable Postop Assessment: no headache, no backache, patient able to bend at knees, no apparent nausea or vomiting, adequate PO intake and able to ambulate Anesthetic complications: no    Last Vitals:  Vitals:   09/24/18 0358 09/24/18 0800  BP: 103/68 102/60  Pulse: 74 65  Resp: 16 18  Temp: 36.8 C 36.6 C  SpO2: 97%     Last Pain:  Vitals:   09/24/18 0800  TempSrc: Oral  PainSc: 2    Pain Goal:                 Lowen Barringer

## 2018-09-24 NOTE — Lactation Note (Signed)
This note was copied from a baby's chart. Lactation Consultation Note Baby 8 hrs old.  Experienced BF mom BF her first child for 10 months until she found out she was pregnant with this baby. Mom states BF going well. Mom is breast/formula. Mom c/o severe cramping when feeding. Explained normal. Reported to RN of c/o pain. Newborn feeding habits discussed. Room very warm and baby swaddled in several blankets. Discussed feeding baby on cues and every three hours if hasn't cued. If baby is to hot baby may be sleepy and can be dangerous if baby over heats. Encouraged mom to BF before giving formula. Discussed supply and demand. Encouraged to call for assistance or questions.  WH/LC brochure given w/resources, support groups and LC services.  Patient Name: Katrina [name] [id number] Date: 09/24/2018 Reason for consult: Initial assessment;Early term 37-38.6wks   Maternal Data Has patient been taught Hand Expression?: Yes Does the patient have breastfeeding experience prior to this delivery?: Yes  Feeding Feeding Type: Breast Fed  LATCH Score Latch: Grasps breast easily, tongue down, lips flanged, rhythmical sucking.  Audible Swallowing: A few with stimulation  Type of Nipple: Everted at rest and after stimulation  Comfort (Breast/Nipple): Soft / non-tender  Hold (Positioning): No assistance needed to correctly position infant at breast.  LATCH Score: 9  Interventions Interventions: Breast feeding basics reviewed  Lactation Tools Discussed/Used WIC Program: Yes   Consult Status Consult Status: Follow-up Date: 09/25/18 Follow-up type: In-patient    Jhony Antrim, Diamond Nickel 09/24/2018, 5:12 AM

## 2018-09-24 NOTE — Progress Notes (Signed)
Patient is eating, ambulating, voiding.  Pain control is good.  Appropriate lochia, no complaints.  Vitals:   09/23/18 2250 09/24/18 0000 09/24/18 0358 09/24/18 0800  BP: 107/71 111/72 103/68 102/60  Pulse: 63 91 74 65  Resp: 18 18 16 18   Temp: 97.7 F (36.5 C) 98.1 F (36.7 C) 98.2 F (36.8 C) 97.8 F (36.6 C)  TempSrc: Oral Oral Oral Oral  SpO2: 99% 98% 97%     Fundus firm Perineum without swelling.  Lab Results  Component Value Date   WBC 12.0 (H) 09/24/2018   HGB 9.8 (L) 09/24/2018   HCT 28.8 (L) 09/24/2018   MCV 86.5 09/24/2018   PLT 343 09/24/2018    --/--/B POS (10/29 0944)/  A/P Post partum day 1.  Routine care.  Expect d/c 10/31.    Philip Aspen

## 2018-09-25 MED ORDER — OXYCODONE-ACETAMINOPHEN 5-325 MG PO TABS: 1.0000 | ORAL_TABLET | Freq: Four times a day (QID) | ORAL | 0 refills | Status: DC | PRN

## 2018-09-25 MED ORDER — IBUPROFEN 600 MG PO TABS: 600.0000 mg | ORAL_TABLET | Freq: Four times a day (QID) | ORAL | 0 refills | Status: DC | PRN

## 2018-09-25 MED ORDER — DOCUSATE SODIUM 100 MG PO CAPS: 100.0000 mg | ORAL_CAPSULE | Freq: Two times a day (BID) | ORAL | 0 refills | Status: DC

## 2018-09-25 NOTE — Lactation Note (Addendum)
This note was copied from a baby's chart. Lactation Consultation Note Baby 23 hrs old. Experienced BF mom has been BF well per mom. Discussed feeding baby longer, w/breast massage occasionally while feeding. Mom is breast/formula. Has only given 2 formula bottles, one each night for past 2 nights. Room is very hot! Baby swaddled in blankets, hat, clothes. Stressed importance of getting baby to hot. Also discussed baby will not feed as well if its to hot. Encouraged STS during feeding or at least no blankets. Stressed importance of I&O.  Mom states she knows about breast filling and milk coming in.  Encouraged to cont. Document I&O at home and take to Dr. F/u appt. For baby. Discussed output should be increasing. Baby has lost 6 % in 33 hrs.  Encouraged mom to call Md if output decreases and not increase.  Mom has resource information sheet and LC OP number. FOB at bedside. Mom feels BF going well.  Patient Name: Katrina [name] [id number] Date: 09/25/2018 Reason for consult: Follow-up assessment;Early term 37-38.6wks   Maternal Data    Feeding Feeding Type: Breast Fed  LATCH Score                   Interventions Interventions: Breast feeding basics reviewed;Support pillows;Breast massage;Breast compression  Lactation Tools Discussed/Used     Consult Status Consult Status: Complete Date: 09/25/18    Charyl Dancer 09/25/2018, 5:53 AM

## 2018-09-25 NOTE — Discharge Summary (Signed)
Obstetric Discharge Summary Reason for Admission: induction of labor Prenatal Procedures: NST and ultrasound Intrapartum Procedures: spontaneous vaginal delivery Postpartum Procedures: none Complications-Operative and Postpartum: 2nd degree perineal laceration Hemoglobin  Date Value Ref Range Status  09/24/2018 9.8 (L) 12.0 - 15.0 g/dL Final   HCT  Date Value Ref Range Status  09/24/2018 28.8 (L) 36.0 - 46.0 % Final    Physical Exam:  General: alert, cooperative and appears stated age 32: appropriate Uterine Fundus: firm Incision: healing well DVT Evaluation: No evidence of DVT seen on physical exam.  Discharge Diagnoses: Term Pregnancy-delivered  Discharge Information: Date: 09/25/2018 Activity: pelvic rest Diet: routine Medications: Ibuprofen, Colace and Percocet Condition: improved Instructions: refer to practice specific booklet Discharge to: home Follow-up Information    Carrington Clamp, MD Follow up in 4 week(s).   Specialty:  Obstetrics and Gynecology Why:  For a pp eval Contact information: [address] [place] [address] [phone]           Newborn Data: Live born female  Birth Weight: 6 lb 12.3 oz (3070 g) APGAR: 8, 9  Newborn Delivery   Birth date/time:  09/23/2018 20:44:00 Delivery type:  Vaginal, Spontaneous     Home with mother.  Waynard Reeds 09/25/2018, 9:13 AM

## 2020-02-18 ENCOUNTER — Ambulatory Visit: Payer: Self-pay | Attending: Internal Medicine

## 2020-02-18 DIAGNOSIS — Z23 Encounter for immunization: Secondary | ICD-10-CM

## 2020-02-18 NOTE — Progress Notes (Signed)
   Covid-19 Vaccination Clinic  Name:  Analucia Hush    MRN: [id number] DOB: 09/24/1986  02/18/2020  Ms. Parlee was observed post Covid-19 immunization for 15 minutes without incident. She was provided with Vaccine Information Sheet and instruction to access the V-Safe system.   Ms. Laureano was instructed to call 911 with any severe reactions post vaccine: Marland Kitchen Difficulty breathing  . Swelling of face and throat  . A fast heartbeat  . A bad rash all over body  . Dizziness and weakness   Immunizations Administered    Name Date Dose VIS Date Route   Pfizer COVID-19 Vaccine 02/18/2020 10:52 AM 0.3 mL 11/06/2019 Intramuscular   Manufacturer: [employer], [employer]   Lot: [id number]   NDC: [address]-4

## 2020-03-15 ENCOUNTER — Ambulatory Visit: Payer: Self-pay | Attending: Internal Medicine

## 2020-03-15 DIAGNOSIS — Z23 Encounter for immunization: Secondary | ICD-10-CM

## 2020-03-15 NOTE — Progress Notes (Signed)
   Covid-19 Vaccination Clinic  Name:  Katrina [name]    MRN: [id number] DOB: 1986-03-28  03/15/2020  [name] was observed post Covid-19 immunization for 15 minutes without incident. She was provided with Vaccine Information Sheet and instruction to access the V-Safe system.   [name] was instructed to call 911 with any severe reactions post vaccine: Marland Kitchen Difficulty breathing  . Swelling of face and throat  . A fast heartbeat  . A bad rash all over body  . Dizziness and weakness   Immunizations Administered    Name Date Dose VIS Date Route   Pfizer COVID-19 Vaccine 03/15/2020  9:48 AM 0.3 mL 01/20/2019 Intramuscular   Manufacturer: [employer], [employer]   Lot: [id number]   NDC: [address]-4

## 2020-11-16 ENCOUNTER — Telehealth: Payer: Self-pay | Admitting: Family Medicine

## 2020-11-16 NOTE — Telephone Encounter (Signed)
[facility] called to get pt NEW GYN appt but I could not hear after caller was getting a zip code and I held the line for several minutes but no response.

## 2020-11-26 NOTE — L&D Delivery Note (Signed)
Patient was C/C/+2 and pushed for 30 minutes with epidural.    NSVD  female infant, Apgars 9.9, weight P, OP presentation. The patient had a laceration second degree midline perineal repaired with 2-0 vicryl. Fundus was firm. EBL was expected amount. Placenta was delivered intact. Vagina was clear.  Delayed cord clamping done for 30-60 seconds while warming baby. Baby was vigorous and doing skin to skin with mother.  Katrina [name]

## 2021-01-05 ENCOUNTER — Encounter: Payer: Self-pay | Admitting: Certified Nurse Midwife

## 2021-01-05 ENCOUNTER — Ambulatory Visit: Payer: Self-pay

## 2021-01-17 LAB — OB RESULTS CONSOLE GC/CHLAMYDIA
Chlamydia: NEGATIVE
Gonorrhea: NEGATIVE

## 2021-02-23 LAB — OB RESULTS CONSOLE ABO/RH: RH Type: POSITIVE

## 2021-02-23 LAB — OB RESULTS CONSOLE HIV ANTIBODY (ROUTINE TESTING): HIV: NONREACTIVE

## 2021-02-23 LAB — OB RESULTS CONSOLE HEPATITIS B SURFACE ANTIGEN: Hepatitis B Surface Ag: NEGATIVE

## 2021-02-23 LAB — OB RESULTS CONSOLE RUBELLA ANTIBODY, IGM: Rubella: IMMUNE

## 2021-02-23 LAB — OB RESULTS CONSOLE ANTIBODY SCREEN: Antibody Screen: NEGATIVE

## 2021-02-23 LAB — OB RESULTS CONSOLE RPR: RPR: NONREACTIVE

## 2021-05-16 ENCOUNTER — Other Ambulatory Visit: Payer: Self-pay | Admitting: Obstetrics and Gynecology

## 2021-05-16 DIAGNOSIS — Z363 Encounter for antenatal screening for malformations: Secondary | ICD-10-CM

## 2021-05-30 ENCOUNTER — Encounter: Payer: Self-pay | Admitting: *Deleted

## 2021-06-02 ENCOUNTER — Ambulatory Visit (HOSPITAL_BASED_OUTPATIENT_CLINIC_OR_DEPARTMENT_OTHER): Payer: Medicaid Other | Admitting: Obstetrics

## 2021-06-02 ENCOUNTER — Other Ambulatory Visit: Payer: Self-pay

## 2021-06-02 ENCOUNTER — Encounter: Payer: Self-pay | Admitting: *Deleted

## 2021-06-02 ENCOUNTER — Ambulatory Visit: Payer: Medicaid Other | Admitting: *Deleted

## 2021-06-02 ENCOUNTER — Other Ambulatory Visit: Payer: Self-pay | Admitting: *Deleted

## 2021-06-02 ENCOUNTER — Ambulatory Visit: Payer: Medicaid Other | Attending: Obstetrics and Gynecology

## 2021-06-02 VITALS — BP 103/58 | HR 88

## 2021-06-02 DIAGNOSIS — O359XX0 Maternal care for (suspected) fetal abnormality and damage, unspecified, not applicable or unspecified: Secondary | ICD-10-CM

## 2021-06-02 DIAGNOSIS — Z363 Encounter for antenatal screening for malformations: Secondary | ICD-10-CM | POA: Diagnosis present

## 2021-06-02 DIAGNOSIS — O35EXX0 Maternal care for other (suspected) fetal abnormality and damage, fetal genitourinary anomalies, not applicable or unspecified: Secondary | ICD-10-CM

## 2021-06-02 DIAGNOSIS — O358XX0 Maternal care for other (suspected) fetal abnormality and damage, not applicable or unspecified: Secondary | ICD-10-CM

## 2021-06-02 DIAGNOSIS — Z3A26 26 weeks gestation of pregnancy: Secondary | ICD-10-CM | POA: Diagnosis present

## 2021-06-02 DIAGNOSIS — O09292 Supervision of pregnancy with other poor reproductive or obstetric history, second trimester: Secondary | ICD-10-CM

## 2021-06-03 NOTE — Progress Notes (Signed)
MFM Note  Katrina [name] was seen for a detailed fetal anatomy scan as mild bilateral pyelectasis was noted on a recent ultrasound performed in your office.  This is the patient's third pregnancy.  She reports that her two prior pregnancies were complicated by cholestasis of pregnancy requiring delivery at between 36 to 37 weeks.  She reports that she is beginning to develop itching symptoms in her current pregnancy and will be screened for cholestasis of pregnancy at her next prenatal visit.   She denies any significant past medical history.  She has declined all screening tests for fetal aneuploidy in her current pregnancy.  She was informed that the fetal growth and amniotic fluid level were appropriate for her gestational age.   Bilateral pyelectasis measuring  0.5 to 0.6 cm dilated was noted on today's ultrasound exam. The implications and management of pylectasiswas discussed with the patient. She was advised that the pyelectasis noted on prenatal ultrasounds will often resolve spontaneously after birth.  However, there may also be other processes such as an obstruction or reflux causing this finding that may require treatment after birth.  She was advised that we will continue to follow her closely to assess this finding.  Should the renal pelvis continue to enlarge, we will consider a referral to pediatric urology/nephrology for a prenatal consultation.  The small association between pyelectasis and Down syndrome was discussed.  She was offered and declined an amniocentesis today for definitive diagnosis of fetal aneuploidy. She was also offered and declined a cell free DNA test to screen for fetal aneuploidy today.  The views of the fetal anatomy were obtained by the sonographer today as I was not allowed to scan the patient myself due to her religious beliefs.  The patient was informed that anomalies may be missed due to technical limitations. If the fetus is in a suboptimal position or  maternal habitus is increased, visualization of the fetus in the maternal uterus may be impaired.  As the patient is complaining of some itching symptoms, she should be screened for cholestasis of pregnancy at her next prenatal visit.    Should she be diagnosed with cholestasis of pregnancy again in her current pregnancy, treatment with Actigall as recommended. Weekly fetal testing will be recommended starting at around 28 weeks.    A follow-up exam was scheduled in 4 weeks to assess the fetal growth and fetal kidneys.    The patient stated that all of her questions have been answered to her complete satisfaction.    A total of 30 minutes was spent counseling and coordinating the care for this patient.  Greater than 50% of the time was spent in direct face-to-face contact.

## 2021-07-04 ENCOUNTER — Ambulatory Visit: Payer: Medicaid Other | Admitting: Registered"

## 2021-07-04 ENCOUNTER — Encounter: Payer: Medicaid Other | Attending: Obstetrics and Gynecology | Admitting: Registered"

## 2021-07-04 ENCOUNTER — Other Ambulatory Visit: Payer: Self-pay

## 2021-07-04 DIAGNOSIS — O24419 Gestational diabetes mellitus in pregnancy, unspecified control: Secondary | ICD-10-CM | POA: Diagnosis present

## 2021-07-05 ENCOUNTER — Encounter: Payer: Self-pay | Admitting: Registered"

## 2021-07-05 DIAGNOSIS — O24419 Gestational diabetes mellitus in pregnancy, unspecified control: Secondary | ICD-10-CM | POA: Insufficient documentation

## 2021-07-05 NOTE — Progress Notes (Signed)
Patient was seen for Gestational Diabetes self-management on 07/04/2021  Start time 1520 and End time 1620   Estimated due date: 09/05/21; [redacted]w[redacted]d  Clinical: Medications: iron, prenatal, levothyroxine Medical History: Choestasis, hypothyroidsim Labs: OGTT --, A1c --%   Dietary and Lifestyle History: Patient states she did not have GDM with previous 2 pregnancies.Patient states she relies on convenience foods often because her schedule is very busy with working and taking care of other 2 children. Pt states her husband helps sometimes, but he is busy too.  Physical Activity: ADLs Stress: Sleep: pt states not enough  24 hr Recall: (not assessed this visit)  NUTRITION INTERVENTION  Nutrition education (E-1) on the following topics:   Initial Follow-up  [x]  []  Definition of Gestational Diabetes [x]  []  Why dietary management is important in controlling blood glucose [x]  []  Effects each nutrient has on blood glucose levels []  []  Simple carbohydrates vs complex carbohydrates []  []  Fluid intake [x]  []  Creating a balanced meal plan [x]  []  Carbohydrate counting  [x]  []  When to check blood glucose levels [x]  []  Proper blood glucose monitoring techniques [x]  []  Effect of stress and stress reduction techniques  [x]  []  Exercise effect on blood glucose levels, appropriate exercise during pregnancy [x]  []  Importance of limiting caffeine and abstaining from alcohol and smoking [x]  []  Medications used for blood sugar control during pregnancy [x]  []  Hypoglycemia and rule of 15 [x]  []  Postpartum self care  Blood glucose monitor given: Accu-chek Guide Me Lot Exp: 03/31/2022 CBG: 143 mg/dL   Patient instructed to monitor glucose levels: FBS: 60 - ? 95 mg/dL (some clinics use 90 for cutoff) 1 hour: ? 140 mg/dL 2 hour: ? mg/dL  Patient received handouts: Nutrition Diabetes and Pregnancy Carbohydrate Counting List  Patient will be seen for follow-up as needed.

## 2021-07-07 ENCOUNTER — Ambulatory Visit: Payer: Medicaid Other | Admitting: *Deleted

## 2021-07-07 ENCOUNTER — Ambulatory Visit: Payer: Medicaid Other | Attending: Obstetrics

## 2021-07-07 ENCOUNTER — Encounter: Payer: Self-pay | Admitting: *Deleted

## 2021-07-07 ENCOUNTER — Other Ambulatory Visit: Payer: Self-pay

## 2021-07-07 VITALS — BP 105/60 | HR 73

## 2021-07-07 DIAGNOSIS — E039 Hypothyroidism, unspecified: Secondary | ICD-10-CM

## 2021-07-07 DIAGNOSIS — O322XX0 Maternal care for transverse and oblique lie, not applicable or unspecified: Secondary | ICD-10-CM

## 2021-07-07 DIAGNOSIS — O09293 Supervision of pregnancy with other poor reproductive or obstetric history, third trimester: Secondary | ICD-10-CM

## 2021-07-07 DIAGNOSIS — O35EXX0 Maternal care for other (suspected) fetal abnormality and damage, fetal genitourinary anomalies, not applicable or unspecified: Secondary | ICD-10-CM

## 2021-07-07 DIAGNOSIS — O358XX0 Maternal care for other (suspected) fetal abnormality and damage, not applicable or unspecified: Secondary | ICD-10-CM

## 2021-07-07 DIAGNOSIS — O99283 Endocrine, nutritional and metabolic diseases complicating pregnancy, third trimester: Secondary | ICD-10-CM

## 2021-07-07 DIAGNOSIS — O24410 Gestational diabetes mellitus in pregnancy, diet controlled: Secondary | ICD-10-CM

## 2021-07-07 DIAGNOSIS — Z3A31 31 weeks gestation of pregnancy: Secondary | ICD-10-CM

## 2021-07-10 ENCOUNTER — Other Ambulatory Visit: Payer: Self-pay | Admitting: *Deleted

## 2021-07-10 DIAGNOSIS — Z8719 Personal history of other diseases of the digestive system: Secondary | ICD-10-CM

## 2021-07-10 DIAGNOSIS — O24410 Gestational diabetes mellitus in pregnancy, diet controlled: Secondary | ICD-10-CM

## 2021-07-10 DIAGNOSIS — Z8759 Personal history of other complications of pregnancy, childbirth and the puerperium: Secondary | ICD-10-CM

## 2021-07-10 DIAGNOSIS — E039 Hypothyroidism, unspecified: Secondary | ICD-10-CM

## 2021-08-02 ENCOUNTER — Encounter (HOSPITAL_COMMUNITY): Payer: Self-pay | Admitting: Obstetrics and Gynecology

## 2021-08-02 ENCOUNTER — Inpatient Hospital Stay (HOSPITAL_COMMUNITY)
Admission: AD | Admit: 2021-08-02 | Discharge: 2021-08-02 | Disposition: A | Discharge status: Home / Self Care | Payer: Medicaid Other | Attending: Obstetrics and Gynecology | Admitting: Obstetrics and Gynecology

## 2021-08-02 ENCOUNTER — Inpatient Hospital Stay (HOSPITAL_BASED_OUTPATIENT_CLINIC_OR_DEPARTMENT_OTHER): Payer: Medicaid Other

## 2021-08-02 ENCOUNTER — Other Ambulatory Visit: Payer: Self-pay

## 2021-08-02 DIAGNOSIS — Z3493 Encounter for supervision of normal pregnancy, unspecified, third trimester: Secondary | ICD-10-CM

## 2021-08-02 DIAGNOSIS — O368130 Decreased fetal movements, third trimester, not applicable or unspecified: Secondary | ICD-10-CM | POA: Insufficient documentation

## 2021-08-02 DIAGNOSIS — Z3689 Encounter for other specified antenatal screening: Secondary | ICD-10-CM | POA: Insufficient documentation

## 2021-08-02 DIAGNOSIS — Z7984 Long term (current) use of oral hypoglycemic drugs: Secondary | ICD-10-CM | POA: Diagnosis not present

## 2021-08-02 DIAGNOSIS — Z79899 Other long term (current) drug therapy: Secondary | ICD-10-CM | POA: Insufficient documentation

## 2021-08-02 DIAGNOSIS — Z3A35 35 weeks gestation of pregnancy: Secondary | ICD-10-CM | POA: Insufficient documentation

## 2021-08-02 DIAGNOSIS — Z880 Allergy status to penicillin: Secondary | ICD-10-CM | POA: Diagnosis not present

## 2021-08-02 DIAGNOSIS — Z791 Long term (current) use of non-steroidal anti-inflammatories (NSAID): Secondary | ICD-10-CM | POA: Insufficient documentation

## 2021-08-02 NOTE — MAU Note (Addendum)
Pt reports decreased fetal movement today after 1300.  Pt reports she was seen at the office today and was told to come to MAU for an ultrasound. Denies vaginal bleeding or LOF.

## 2021-08-02 NOTE — Discharge Instructions (Signed)

## 2021-08-02 NOTE — MAU Provider Note (Signed)
History     CSN: [id number]  Arrival date and time: 08/02/21 1656   Event Date/Time   First Provider Initiated Contact with Patient 08/02/21 1727      Chief Complaint  Patient presents with   Decreased Fetal Movement   HPI Katrina [name] is a 35 y.o. G3P2002 at [redacted]w[redacted]d who presents with decreased fetal movement since 1300. She was seen in the office this afternoon and states she was sent to MAU for an ultrasound because of the decreased fetal movement. She is feeling movement since her arrival to MAU. She denies any bleeding or leaking. She denies any pain. She is requesting a cervical exam.  OB History     Gravida  3   Para  2   Term  2   Preterm      AB      Living  2      SAB      IAB      Ectopic      Multiple  0   Live Births  2           Past Medical History:  Diagnosis Date   Cholestasis    Depression    Hypothyroidism     Past Surgical History:  Procedure Laterality Date   NO PAST SURGERIES      Family History  Problem Relation Age of Onset   Cancer Mother     Social History   Tobacco Use   Smoking status: Never   Smokeless tobacco: Never  Vaping Use   Vaping Use: Never used  Substance Use Topics   Alcohol use: Never   Drug use: No    Allergies:  Allergies  Allergen Reactions   Penicillins Rash    Has patient had a PCN reaction causing immediate rash, facial/tongue/throat swelling, SOB or lightheadedness with hypotension: Unknown Has patient had a PCN reaction causing severe rash involving mucus membranes or skin necrosis: Unknown Has patient had a PCN reaction that required hospitalization Unknown Has patient had a PCN reaction occurring within the last 10 years: No If all of the above answers are "NO", then may proceed with Cephalosporin use.     Medications Prior to Admission  Medication Sig Dispense Refill Last Dose   FLUoxetine HCl (PROZAC PO) Take by mouth.   08/01/2021   glyBURIDE (DIABETA) 2.5 MG tablet Take  2.5 mg by mouth daily with breakfast.   08/02/2021   levothyroxine (SYNTHROID, LEVOTHROID) 100 MCG tablet Take 150 mcg by mouth daily before breakfast.   08/02/2021   docusate sodium (COLACE) 100 MG capsule Take 1 capsule (100 mg total) by mouth 2 (two) times daily. 60 capsule 0    ferrous sulfate 325 (65 FE) MG tablet Take 325 mg by mouth at bedtime.       ibuprofen (ADVIL,MOTRIN) 600 MG tablet Take 1 tablet (600 mg total) by mouth every 6 (six) hours as needed. 90 tablet 0    oxyCODONE-acetaminophen (PERCOCET/ROXICET) 5-325 MG tablet Take 1-2 tablets by mouth every 6 (six) hours as needed for severe pain. (Patient not taking: Reported on 06/02/2021) 10 tablet 0    Prenatal Vit-Fe Fumarate-FA (PRENATAL MULTIVITAMIN) TABS tablet Take 1 tablet by mouth at bedtime.       Review of Systems  Constitutional: Negative.  Negative for fatigue and fever.  HENT: Negative.    Respiratory: Negative.  Negative for shortness of breath.   Cardiovascular: Negative.  Negative for chest pain.  Gastrointestinal: Negative.  Negative for abdominal  pain, constipation, diarrhea, nausea and vomiting.  Genitourinary: Negative.  Negative for dysuria, vaginal bleeding and vaginal discharge.  Neurological: Negative.  Negative for dizziness and headaches.  Physical Exam   Blood pressure (!) 117/59, pulse 77, temperature 98.1 F (36.7 C), resp. rate 16, last menstrual period 11/14/2020, SpO2 100 %, unknown if currently breastfeeding.  Physical Exam Vitals and nursing note reviewed.  Constitutional:      General: She is not in acute distress.    Appearance: She is well-developed.  HENT:     Head: Normocephalic.  Eyes:     Pupils: Pupils are equal, round, and reactive to light.  Cardiovascular:     Rate and Rhythm: Normal rate and regular rhythm.     Heart sounds: Normal heart sounds.  Pulmonary:     Effort: Pulmonary effort is normal. No respiratory distress.     Breath sounds: Normal breath sounds.  Abdominal:      General: Bowel sounds are normal. There is no distension.     Palpations: Abdomen is soft.     Tenderness: There is no abdominal tenderness.  Skin:    General: Skin is warm and dry.  Neurological:     Mental Status: She is alert and oriented to person, place, and time.  Psychiatric:        Mood and Affect: Mood normal.        Behavior: Behavior normal.        Thought Content: Thought content normal.        Judgment: Judgment normal.   Fetal Tracing:  Baseline: Variability: Accels: Decels:  Toco:   Cervix: 1/thick/ballotable  MAU Course  Procedures  MDM NST reactive [place] MFM BPP- 8/8 with normal AFI  Reviewed normalcy of exam and reassurance provided. Discussed kick counts at length.   Assessment and Plan   1. Fetal movement present during pregnancy in third trimester   2. NST (non-stress test) reactive   3. [redacted] weeks gestation of pregnancy    -Discharge home in stable condition -Fetal movement precautions discussed -Patient advised to follow-up with OB as scheduled for prenatal care -Patient may return to MAU as needed or if her condition were to change or worsen   Rolm Bookbinder CNM 08/02/2021, 5:27 PM

## 2021-08-04 ENCOUNTER — Other Ambulatory Visit: Payer: Self-pay | Admitting: *Deleted

## 2021-08-04 ENCOUNTER — Ambulatory Visit: Payer: Medicaid Other | Attending: Obstetrics and Gynecology

## 2021-08-04 ENCOUNTER — Other Ambulatory Visit: Payer: Self-pay

## 2021-08-04 ENCOUNTER — Ambulatory Visit: Payer: Medicaid Other | Admitting: *Deleted

## 2021-08-04 ENCOUNTER — Other Ambulatory Visit: Payer: Self-pay | Admitting: Obstetrics and Gynecology

## 2021-08-04 ENCOUNTER — Encounter: Payer: Self-pay | Admitting: *Deleted

## 2021-08-04 VITALS — BP 116/58 | HR 73

## 2021-08-04 DIAGNOSIS — Z8759 Personal history of other complications of pregnancy, childbirth and the puerperium: Secondary | ICD-10-CM | POA: Insufficient documentation

## 2021-08-04 DIAGNOSIS — Z8719 Personal history of other diseases of the digestive system: Secondary | ICD-10-CM

## 2021-08-04 DIAGNOSIS — E039 Hypothyroidism, unspecified: Secondary | ICD-10-CM | POA: Insufficient documentation

## 2021-08-04 DIAGNOSIS — O24415 Gestational diabetes mellitus in pregnancy, controlled by oral hypoglycemic drugs: Secondary | ICD-10-CM

## 2021-08-04 DIAGNOSIS — O24410 Gestational diabetes mellitus in pregnancy, diet controlled: Secondary | ICD-10-CM

## 2021-08-04 DIAGNOSIS — O99283 Endocrine, nutritional and metabolic diseases complicating pregnancy, third trimester: Secondary | ICD-10-CM

## 2021-08-04 DIAGNOSIS — Z3A35 35 weeks gestation of pregnancy: Secondary | ICD-10-CM

## 2021-08-04 DIAGNOSIS — O99280 Endocrine, nutritional and metabolic diseases complicating pregnancy, unspecified trimester: Secondary | ICD-10-CM | POA: Diagnosis present

## 2021-08-04 DIAGNOSIS — O09293 Supervision of pregnancy with other poor reproductive or obstetric history, third trimester: Secondary | ICD-10-CM | POA: Diagnosis not present

## 2021-08-07 ENCOUNTER — Telehealth: Payer: Self-pay

## 2021-08-08 LAB — OB RESULTS CONSOLE GBS: GBS: POSITIVE

## 2021-08-14 ENCOUNTER — Ambulatory Visit: Payer: Medicaid Other | Admitting: *Deleted

## 2021-08-14 ENCOUNTER — Other Ambulatory Visit: Payer: Self-pay

## 2021-08-14 ENCOUNTER — Other Ambulatory Visit: Payer: Self-pay | Admitting: *Deleted

## 2021-08-14 ENCOUNTER — Ambulatory Visit: Payer: Medicaid Other | Attending: Obstetrics | Admitting: *Deleted

## 2021-08-14 ENCOUNTER — Other Ambulatory Visit: Payer: Medicaid Other

## 2021-08-14 VITALS — BP 116/75 | HR 81

## 2021-08-14 DIAGNOSIS — Z3A36 36 weeks gestation of pregnancy: Secondary | ICD-10-CM | POA: Diagnosis not present

## 2021-08-14 DIAGNOSIS — O24419 Gestational diabetes mellitus in pregnancy, unspecified control: Secondary | ICD-10-CM

## 2021-08-14 DIAGNOSIS — O368130 Decreased fetal movements, third trimester, not applicable or unspecified: Secondary | ICD-10-CM

## 2021-08-14 DIAGNOSIS — O99283 Endocrine, nutritional and metabolic diseases complicating pregnancy, third trimester: Secondary | ICD-10-CM | POA: Diagnosis not present

## 2021-08-14 DIAGNOSIS — O24415 Gestational diabetes mellitus in pregnancy, controlled by oral hypoglycemic drugs: Secondary | ICD-10-CM | POA: Diagnosis present

## 2021-08-14 DIAGNOSIS — E039 Hypothyroidism, unspecified: Secondary | ICD-10-CM | POA: Insufficient documentation

## 2021-08-14 NOTE — Procedures (Signed)
Katrina [name] 1986-10-09 [redacted]w[redacted]d  Fetus A Non-Stress Test Interpretation for 08/14/21  Indication: Gestational Diabetes medication controlled  Fetal Heart Rate A Mode: External Baseline Rate (A): 130 bpm Variability: Moderate Accelerations: 15 x 15 Decelerations: None Multiple birth?: No  Uterine Activity Mode: Palpation, Toco Contraction Frequency (min): Erratic UI Contraction Quality: Mild Resting Tone Palpated: Relaxed Resting Time: Adequate  Interpretation (Fetal Testing) Nonstress Test Interpretation: Reactive Comments: Dr. Parke Poisson reviewed tracing.

## 2021-08-19 ENCOUNTER — Other Ambulatory Visit: Payer: Self-pay | Admitting: Obstetrics and Gynecology

## 2021-08-21 ENCOUNTER — Ambulatory Visit: Payer: Medicaid Other

## 2021-08-21 ENCOUNTER — Ambulatory Visit: Payer: Medicaid Other | Attending: Obstetrics | Admitting: *Deleted

## 2021-08-21 ENCOUNTER — Ambulatory Visit (HOSPITAL_BASED_OUTPATIENT_CLINIC_OR_DEPARTMENT_OTHER): Payer: Medicaid Other | Admitting: *Deleted

## 2021-08-21 ENCOUNTER — Other Ambulatory Visit: Payer: Self-pay

## 2021-08-21 VITALS — BP 112/66 | HR 78

## 2021-08-21 DIAGNOSIS — Z3A36 36 weeks gestation of pregnancy: Secondary | ICD-10-CM

## 2021-08-21 DIAGNOSIS — K831 Obstruction of bile duct: Secondary | ICD-10-CM | POA: Insufficient documentation

## 2021-08-21 DIAGNOSIS — O26613 Liver and biliary tract disorders in pregnancy, third trimester: Secondary | ICD-10-CM | POA: Insufficient documentation

## 2021-08-21 DIAGNOSIS — Z3A37 37 weeks gestation of pregnancy: Secondary | ICD-10-CM | POA: Diagnosis not present

## 2021-08-21 DIAGNOSIS — O24415 Gestational diabetes mellitus in pregnancy, controlled by oral hypoglycemic drugs: Secondary | ICD-10-CM | POA: Diagnosis not present

## 2021-08-21 DIAGNOSIS — O368190 Decreased fetal movements, unspecified trimester, not applicable or unspecified: Secondary | ICD-10-CM

## 2021-08-21 NOTE — Procedures (Addendum)
Katrina [name] Aug 22, 1986 [redacted]w[redacted]d  Fetus A Non-Stress Test Interpretation for 08/21/21  Indication: Gestational Diabetes medication controlled, cholestasis  Fetal Heart Rate A Mode: External Baseline Rate (A): 130 bpm Variability: Moderate Accelerations: 15 x 15 Decelerations: None Multiple birth?: No  Uterine Activity Mode: Palpation, Toco Contraction Frequency (min): 1 uc with ui Contraction Duration (sec): 50 Contraction Quality: Mild Resting Tone Palpated: Relaxed Resting Time: Adequate  Interpretation (Fetal Testing) Nonstress Test Interpretation: Reactive Overall Impression: Reassuring for gestational age Comments: Dr. Judeth Cornfield reviewed tracing  Recommended fetal growth assessment next. But, patient is unable to return because of previous commitments.

## 2021-08-23 ENCOUNTER — Telehealth (HOSPITAL_COMMUNITY): Payer: Self-pay | Admitting: *Deleted

## 2021-08-23 NOTE — Telephone Encounter (Signed)
Preadmission screen  

## 2021-08-24 ENCOUNTER — Telehealth (HOSPITAL_COMMUNITY): Payer: Self-pay | Admitting: *Deleted

## 2021-08-24 NOTE — Telephone Encounter (Signed)
Preadmission screen  

## 2021-08-28 ENCOUNTER — Telehealth (HOSPITAL_COMMUNITY): Payer: Self-pay | Admitting: *Deleted

## 2021-08-28 NOTE — Telephone Encounter (Signed)
Preadmission screen  

## 2021-08-29 ENCOUNTER — Inpatient Hospital Stay (HOSPITAL_COMMUNITY): Payer: Medicaid Other

## 2021-08-29 ENCOUNTER — Inpatient Hospital Stay (HOSPITAL_COMMUNITY)
Admission: AD | Admit: 2021-08-29 | Payer: Medicaid Other | Source: Home / Self Care | Admitting: Obstetrics and Gynecology

## 2021-08-29 ENCOUNTER — Encounter (HOSPITAL_COMMUNITY): Payer: Self-pay | Admitting: *Deleted

## 2021-08-29 ENCOUNTER — Other Ambulatory Visit: Payer: Self-pay | Admitting: Obstetrics and Gynecology

## 2021-08-29 ENCOUNTER — Telehealth (HOSPITAL_COMMUNITY): Payer: Self-pay | Admitting: *Deleted

## 2021-08-29 LAB — SARS CORONAVIRUS 2 (TAT 6-24 HRS): SARS Coronavirus 2: NEGATIVE

## 2021-08-29 NOTE — Telephone Encounter (Signed)
Preadmission screen  

## 2021-08-31 ENCOUNTER — Inpatient Hospital Stay (HOSPITAL_COMMUNITY)
Admission: AD | Admit: 2021-08-31 | Discharge: 2021-09-02 | DRG: 807 | Disposition: A | Discharge status: Home / Self Care | Payer: Medicaid Other | Attending: Obstetrics and Gynecology | Admitting: Obstetrics and Gynecology

## 2021-08-31 ENCOUNTER — Inpatient Hospital Stay (HOSPITAL_COMMUNITY): Payer: Medicaid Other

## 2021-08-31 ENCOUNTER — Inpatient Hospital Stay (HOSPITAL_COMMUNITY): Payer: Medicaid Other | Admitting: Anesthesiology

## 2021-08-31 ENCOUNTER — Encounter (HOSPITAL_COMMUNITY): Payer: Self-pay | Admitting: Obstetrics and Gynecology

## 2021-08-31 ENCOUNTER — Other Ambulatory Visit: Payer: Self-pay

## 2021-08-31 DIAGNOSIS — O99284 Endocrine, nutritional and metabolic diseases complicating childbirth: Secondary | ICD-10-CM | POA: Diagnosis present

## 2021-08-31 DIAGNOSIS — O99824 Streptococcus B carrier state complicating childbirth: Secondary | ICD-10-CM | POA: Diagnosis present

## 2021-08-31 DIAGNOSIS — E039 Hypothyroidism, unspecified: Secondary | ICD-10-CM | POA: Diagnosis present

## 2021-08-31 DIAGNOSIS — O24425 Gestational diabetes mellitus in childbirth, controlled by oral hypoglycemic drugs: Secondary | ICD-10-CM | POA: Diagnosis present

## 2021-08-31 DIAGNOSIS — Z3A39 39 weeks gestation of pregnancy: Secondary | ICD-10-CM | POA: Diagnosis not present

## 2021-08-31 DIAGNOSIS — Z3490 Encounter for supervision of normal pregnancy, unspecified, unspecified trimester: Secondary | ICD-10-CM | POA: Diagnosis present

## 2021-08-31 LAB — TYPE AND SCREEN
ABO/RH(D): B POS
Antibody Screen: NEGATIVE

## 2021-08-31 LAB — CBC
HCT: 30.8 % — ABNORMAL LOW (ref 36.0–46.0)
Hemoglobin: 10.2 g/dL — ABNORMAL LOW (ref 12.0–15.0)
MCH: 29.1 pg (ref 26.0–34.0)
MCHC: 33.1 g/dL (ref 30.0–36.0)
MCV: 88 fL (ref 80.0–100.0)
Platelets: 345 10*3/uL (ref 150–400)
RBC: 3.5 MIL/uL — ABNORMAL LOW (ref 3.87–5.11)
RDW: 14.4 % (ref 11.5–15.5)
WBC: 8.9 10*3/uL (ref 4.0–10.5)
nRBC: 0 % (ref 0.0–0.2)

## 2021-08-31 LAB — GLUCOSE, CAPILLARY
Glucose-Capillary: 76 mg/dL (ref 70–99)
Glucose-Capillary: 74 mg/dL (ref 70–99)

## 2021-08-31 MED ORDER — EPHEDRINE 5 MG/ML INJ: 10.0000 mg | INTRAVENOUS | Status: DC | PRN

## 2021-08-31 MED ORDER — WITCH HAZEL-GLYCERIN EX PADS: 1.0000 "application " | MEDICATED_PAD | CUTANEOUS | Status: DC | PRN

## 2021-08-31 MED ORDER — FENTANYL-BUPIVACAINE-NACL 0.5-0.125-0.9 MG/250ML-% EP SOLN
12.0000 mL/h | EPIDURAL | Status: DC | PRN
  Administered 2021-08-31: 12 mL/h via EPIDURAL
  Filled 2021-08-31: qty 250

## 2021-08-31 MED ORDER — SOD CITRATE-CITRIC ACID 500-334 MG/5ML PO SOLN: 30.0000 mL | ORAL | Status: DC | PRN

## 2021-08-31 MED ORDER — LACTATED RINGERS IV SOLN: 500.0000 mL | Freq: Once | INTRAVENOUS | Status: DC

## 2021-08-31 MED ORDER — MEASLES, MUMPS & RUBELLA VAC IJ SOLR: 0.5000 mL | Freq: Once | INTRAMUSCULAR | Status: DC

## 2021-08-31 MED ORDER — LEVOTHYROXINE SODIUM 75 MCG PO TABS
150.0000 ug | ORAL_TABLET | Freq: Every day | ORAL | Status: DC
  Administered 2021-09-01 – 2021-09-02 (×2): 150 ug via ORAL
  Filled 2021-08-31 (×2): qty 2

## 2021-08-31 MED ORDER — PHENYLEPHRINE 40 MCG/ML (10ML) SYRINGE FOR IV PUSH (FOR BLOOD PRESSURE SUPPORT): 80.0000 ug | PREFILLED_SYRINGE | INTRAVENOUS | Status: DC | PRN

## 2021-08-31 MED ORDER — SODIUM CHLORIDE 0.9% FLUSH
3.0000 mL | Freq: Two times a day (BID) | INTRAVENOUS | Status: DC
  Administered 2021-09-01: 3 mL via INTRAVENOUS

## 2021-08-31 MED ORDER — IBUPROFEN 800 MG PO TABS
800.0000 mg | ORAL_TABLET | Freq: Three times a day (TID) | ORAL | Status: DC
  Administered 2021-09-01 – 2021-09-02 (×5): 800 mg via ORAL
  Filled 2021-08-31 (×5): qty 1

## 2021-08-31 MED ORDER — ONDANSETRON HCL 4 MG/2ML IJ SOLN: 4.0000 mg | INTRAMUSCULAR | Status: DC | PRN

## 2021-08-31 MED ORDER — ZOLPIDEM TARTRATE 5 MG PO TABS: 5.0000 mg | ORAL_TABLET | Freq: Every evening | ORAL | Status: DC | PRN

## 2021-08-31 MED ORDER — SIMETHICONE 80 MG PO CHEW: 80.0000 mg | CHEWABLE_TABLET | ORAL | Status: DC | PRN

## 2021-08-31 MED ORDER — OXYTOCIN-SODIUM CHLORIDE 30-0.9 UT/500ML-% IV SOLN
1.0000 m[IU]/min | INTRAVENOUS | Status: DC
  Administered 2021-08-31: 2 m[IU]/min via INTRAVENOUS

## 2021-08-31 MED ORDER — PHENYLEPHRINE 40 MCG/ML (10ML) SYRINGE FOR IV PUSH (FOR BLOOD PRESSURE SUPPORT)
80.0000 ug | PREFILLED_SYRINGE | INTRAVENOUS | Status: DC | PRN
  Filled 2021-08-31: qty 10

## 2021-08-31 MED ORDER — MAGNESIUM HYDROXIDE 400 MG/5ML PO SUSP: 30.0000 mL | ORAL | Status: DC | PRN

## 2021-08-31 MED ORDER — DIBUCAINE (PERIANAL) 1 % EX OINT: 1.0000 "application " | TOPICAL_OINTMENT | CUTANEOUS | Status: DC | PRN

## 2021-08-31 MED ORDER — ACETAMINOPHEN 325 MG PO TABS: 650.0000 mg | ORAL_TABLET | ORAL | Status: DC | PRN

## 2021-08-31 MED ORDER — METHYLERGONOVINE MALEATE 0.2 MG PO TABS
0.2000 mg | ORAL_TABLET | ORAL | Status: DC | PRN
Start: 2021-08-31 — End: 2021-09-02

## 2021-08-31 MED ORDER — SENNOSIDES-DOCUSATE SODIUM 8.6-50 MG PO TABS
2.0000 | ORAL_TABLET | Freq: Every day | ORAL | Status: DC
  Administered 2021-09-01: 2 via ORAL
  Filled 2021-08-31: qty 2

## 2021-08-31 MED ORDER — PRENATAL MULTIVITAMIN CH
1.0000 | ORAL_TABLET | Freq: Every day | ORAL | Status: DC
  Administered 2021-09-01: 1 via ORAL
  Filled 2021-08-31 (×2): qty 1

## 2021-08-31 MED ORDER — TERBUTALINE SULFATE 1 MG/ML IJ SOLN: 0.2500 mg | Freq: Once | INTRAMUSCULAR | Status: DC | PRN

## 2021-08-31 MED ORDER — CEFAZOLIN SODIUM-DEXTROSE 1-4 GM/50ML-% IV SOLN
1.0000 g | Freq: Three times a day (TID) | INTRAVENOUS | Status: DC
  Administered 2021-08-31: 1 g via INTRAVENOUS
  Filled 2021-08-31 (×2): qty 50

## 2021-08-31 MED ORDER — DIPHENHYDRAMINE HCL 25 MG PO CAPS: 25.0000 mg | ORAL_CAPSULE | Freq: Four times a day (QID) | ORAL | Status: DC | PRN

## 2021-08-31 MED ORDER — OXYTOCIN BOLUS FROM INFUSION
333.0000 mL | Freq: Once | INTRAVENOUS | Status: AC
  Administered 2021-08-31: 333 mL via INTRAVENOUS

## 2021-08-31 MED ORDER — ONDANSETRON HCL 4 MG/2ML IJ SOLN
4.0000 mg | Freq: Four times a day (QID) | INTRAMUSCULAR | Status: DC | PRN
  Administered 2021-08-31: 4 mg via INTRAVENOUS
  Filled 2021-08-31: qty 2

## 2021-08-31 MED ORDER — COCONUT OIL OIL: 1.0000 "application " | TOPICAL_OIL | Status: DC | PRN

## 2021-08-31 MED ORDER — LACTATED RINGERS IV SOLN: INTRAVENOUS | Status: DC

## 2021-08-31 MED ORDER — FERROUS SULFATE 325 (65 FE) MG PO TABS
325.0000 mg | ORAL_TABLET | Freq: Two times a day (BID) | ORAL | Status: DC
  Administered 2021-09-01 – 2021-09-02 (×3): 325 mg via ORAL
  Filled 2021-08-31 (×3): qty 1

## 2021-08-31 MED ORDER — METHYLERGONOVINE MALEATE 0.2 MG/ML IJ SOLN
0.2000 mg | INTRAMUSCULAR | Status: DC | PRN
Start: 2021-08-31 — End: 2021-09-02

## 2021-08-31 MED ORDER — LIDOCAINE HCL (PF) 1 % IJ SOLN
INTRAMUSCULAR | Status: DC | PRN
  Administered 2021-08-31: 10 mL via EPIDURAL

## 2021-08-31 MED ORDER — OXYTOCIN-SODIUM CHLORIDE 30-0.9 UT/500ML-% IV SOLN
2.5000 [IU]/h | INTRAVENOUS | Status: DC
  Administered 2021-08-31: 2.5 [IU]/h via INTRAVENOUS
  Filled 2021-08-31: qty 500

## 2021-08-31 MED ORDER — OXYCODONE-ACETAMINOPHEN 5-325 MG PO TABS
2.0000 | ORAL_TABLET | ORAL | Status: DC | PRN
  Administered 2021-08-31 – 2021-09-02 (×5): 2 via ORAL
  Filled 2021-08-31 (×5): qty 2

## 2021-08-31 MED ORDER — DIPHENHYDRAMINE HCL 50 MG/ML IJ SOLN
12.5000 mg | INTRAMUSCULAR | Status: DC | PRN
Start: 2021-08-31 — End: 2021-08-31

## 2021-08-31 MED ORDER — ONDANSETRON HCL 4 MG PO TABS: 4.0000 mg | ORAL_TABLET | ORAL | Status: DC | PRN

## 2021-08-31 MED ORDER — LACTATED RINGERS IV SOLN: 500.0000 mL | INTRAVENOUS | Status: DC | PRN

## 2021-08-31 MED ORDER — BUTORPHANOL TARTRATE 1 MG/ML IJ SOLN: 1.0000 mg | INTRAMUSCULAR | Status: DC | PRN

## 2021-08-31 MED ORDER — LIDOCAINE HCL (PF) 1 % IJ SOLN: 30.0000 mL | INTRAMUSCULAR | Status: DC | PRN

## 2021-08-31 MED ORDER — OXYCODONE-ACETAMINOPHEN 5-325 MG PO TABS: 2.0000 | ORAL_TABLET | ORAL | Status: DC | PRN

## 2021-08-31 MED ORDER — CEFAZOLIN SODIUM-DEXTROSE 2-4 GM/100ML-% IV SOLN
2.0000 g | Freq: Once | INTRAVENOUS | Status: AC
  Administered 2021-08-31: 2 g via INTRAVENOUS
  Filled 2021-08-31: qty 100

## 2021-08-31 MED ORDER — OXYCODONE-ACETAMINOPHEN 5-325 MG PO TABS: 1.0000 | ORAL_TABLET | ORAL | Status: DC | PRN

## 2021-08-31 MED ORDER — TETANUS-DIPHTH-ACELL PERTUSSIS 5-2.5-18.5 LF-MCG/0.5 IM SUSY: 0.5000 mL | PREFILLED_SYRINGE | Freq: Once | INTRAMUSCULAR | Status: DC

## 2021-08-31 MED ORDER — BENZOCAINE-MENTHOL 20-0.5 % EX AERO: 1.0000 "application " | INHALATION_SPRAY | CUTANEOUS | Status: DC | PRN

## 2021-08-31 MED ORDER — FLUOXETINE HCL 20 MG PO CAPS
20.0000 mg | ORAL_CAPSULE | Freq: Every day | ORAL | Status: DC
  Administered 2021-09-01: 20 mg via ORAL
  Filled 2021-08-31: qty 1

## 2021-08-31 MED ORDER — SODIUM CHLORIDE 0.9 % IV SOLN: 250.0000 mL | INTRAVENOUS | Status: DC | PRN

## 2021-08-31 MED ORDER — SODIUM CHLORIDE 0.9% FLUSH: 3.0000 mL | INTRAVENOUS | Status: DC | PRN

## 2021-08-31 NOTE — Anesthesia Preprocedure Evaluation (Signed)
Anesthesia Evaluation  Patient identified by MRN, date of birth, ID band Patient awake    Reviewed: Allergy & Precautions, H&P , NPO status , Patient's Chart, lab work & pertinent test results  History of Anesthesia Complications Negative for: history of anesthetic complications  Airway Mallampati: II  TM Distance: >3 FB     Dental   Pulmonary neg pulmonary ROS,    Pulmonary exam normal        Cardiovascular negative cardio ROS   Rhythm:regular Rate:Normal     Neuro/Psych Depression negative neurological ROS     GI/Hepatic negative GI ROS, Neg liver ROS,   Endo/Other  diabetes, Gestational, Oral Hypoglycemic AgentsHypothyroidism   Renal/GU negative Renal ROS  negative genitourinary   Musculoskeletal   Abdominal   Peds  Hematology negative hematology ROS (+)   Anesthesia Other Findings   Reproductive/Obstetrics (+) Pregnancy                            Anesthesia Physical Anesthesia Plan  ASA: 2  Anesthesia Plan: Epidural   Post-op Pain Management:    Induction:   PONV Risk Score and Plan:   Airway Management Planned:   Additional Equipment:   Intra-op Plan:   Post-operative Plan:   Informed Consent: I have reviewed the patients History and Physical, chart, labs and discussed the procedure including the risks, benefits and alternatives for the proposed anesthesia with the patient or authorized representative who has indicated his/her understanding and acceptance.       Plan Discussed with:   Anesthesia Plan Comments:         Anesthesia Quick Evaluation

## 2021-08-31 NOTE — H&P (Signed)
35 y.o. [redacted]w[redacted]d  G3P2002 GVOBG pt comes in for induction at term for A2GDM.  Pt has history of cholestasis in two prior pregnancies but not in this one.  Sugars controlled with glyburide.  Breech at 35 weeks, spontaneously verted and VTX since.  MFM [place] was ast EFW  - on 08-04-21, 6#9, 80%ile.  ANT in office all normal.  Also has hypothyroidism, on meds.Otherwise has good fetal movement and no bleeding.  Past Medical History:  Diagnosis Date   Cholestasis    Depression    Gestational diabetes    History of cholestasis during pregnancy    Hypothyroidism     Past Surgical History:  Procedure Laterality Date   NO PAST SURGERIES      OB History  Gravida Para Term Preterm AB Living  3 2 2     2   SAB IAB Ectopic Multiple Live Births        0 2    # Outcome Date GA Lbr Len/2nd Weight Sex Delivery Anes PTL Lv  3 Current           2 Term 09/23/18 [redacted]w[redacted]d 04:13 / 00:19 3070 g F Vag-Spont EPI  LIV     Birth Comments: WNL  1 Term 02/06/17 [redacted]w[redacted]d 11:31 / 01:57 2909 g F Vag-Spont EPI  LIV    Social History   Socioeconomic History   Marital status: Married    Spouse name: Not on file   Number of children: Not on file   Years of education: Not on file   Highest education level: Not on file  Occupational History   Not on file  Tobacco Use   Smoking status: Never   Smokeless tobacco: Never  Vaping Use   Vaping Use: Never used  Substance and Sexual Activity   Alcohol use: Never   Drug use: No   Sexual activity: Not on file  Other Topics Concern   Not on file  Social History Narrative   Not on file   Social Determinants of Health   Financial Resource Strain: Not on file  Food Insecurity: No Food Insecurity   Worried About Running Out of Food in the Last Year: Never true   Ran Out of Food in the Last Year: Never true  Transportation Needs: Not on file  Physical Activity: Not on file  Stress: Not on file  Social Connections: Not on file  Intimate Partner Violence: Not on file    Penicillins    Prenatal Transfer Tool  Maternal Diabetes: Yes:  Diabetes Type:  Insulin/Medication controlled Genetic Screening: Normal Maternal Ultrasounds/Referrals: Normal Fetal Ultrasounds or other Referrals:  Referred to Materal Fetal Medicine  Maternal Substance Abuse:  No Significant Maternal Medications:  Meds include: Other: glyburide, prozac, omeprazole, synthroid Significant Maternal Lab Results: Group B Strep positive  Other PNC: uncomplicated.    There were no vitals filed for this visit.  Lungs/Cor:  NAD Abdomen:  soft, gravid Ex:  no cords, erythema SVE:  2/50/-2, AROM clear FHTs: 120s, good STV, NST R; Cat 1 tracing. Toco:  q occ   A/P   Term A2GDM for induction.  GBS POS, susceptible to clinda but cephalosporin already started; pt with low risk all to PCN.  2910

## 2021-08-31 NOTE — Anesthesia Procedure Notes (Signed)
Epidural Patient location during procedure: OB Start time: 08/31/2021 12:12 PM End time: 08/31/2021 12:23 PM  Staffing Anesthesiologist: Lucretia Kern, MD Performed: anesthesiologist   Preanesthetic Checklist Completed: patient identified, IV checked, risks and benefits discussed, monitors and equipment checked, pre-op evaluation and timeout performed  Epidural Patient position: sitting Prep: DuraPrep Patient monitoring: heart rate, continuous pulse ox and blood pressure Approach: midline Location: L3-L4 Injection technique: LOR air  Needle:  Needle type: Tuohy  Needle gauge: 17 G Needle length: 9 cm Needle insertion depth: 4.5 cm Catheter type: closed end flexible Catheter size: 19 Gauge Catheter at skin depth: 9.5 cm Test dose: negative  Assessment Events: blood not aspirated, injection not painful, no injection resistance, no paresthesia and negative IV test  Additional Notes Reason for block:procedure for pain

## 2021-08-31 NOTE — Lactation Note (Signed)
This note was copied from a baby's chart. Lactation Consultation Note  Patient Name: Katrina [name] [id number] Date: 08/31/2021   Age:35 hours LC called L&D ( RN)  on Vocera  and was informed that mom plans to formula feed and declined LC services.  Maternal Data    Feeding    LATCH Score                    Lactation Tools Discussed/Used    Interventions    Discharge    Consult Status      Danelle Earthly 08/31/2021, 8:29 PM

## 2021-09-01 LAB — RPR: RPR Ser Ql: NONREACTIVE

## 2021-09-01 LAB — CBC
HCT: 28.3 % — ABNORMAL LOW (ref 36.0–46.0)
Hemoglobin: 9.6 g/dL — ABNORMAL LOW (ref 12.0–15.0)
MCH: 29.4 pg (ref 26.0–34.0)
MCHC: 33.9 g/dL (ref 30.0–36.0)
MCV: 86.8 fL (ref 80.0–100.0)
Platelets: 312 10*3/uL (ref 150–400)
RBC: 3.26 MIL/uL — ABNORMAL LOW (ref 3.87–5.11)
RDW: 14.3 % (ref 11.5–15.5)
WBC: 15.8 10*3/uL — ABNORMAL HIGH (ref 4.0–10.5)
nRBC: 0 % (ref 0.0–0.2)

## 2021-09-01 NOTE — Discharge Summary (Signed)
Postpartum Discharge Summary  Date of Service updated 09/02/21     Patient Name: Katrina [name] DOB: [birth year] MRN: [id number]  Date of admission: 08/31/2021 Delivery date:08/31/2021  Delivering provider: Carrington Clamp  Date of discharge: 09/02/2021  Admitting diagnosis: Encounter for planned induction of labor [Z34.90] Intrauterine pregnancy: [redacted]w[redacted]d     Secondary diagnosis:  Active Problems:   Encounter for planned induction of labor  Additional problems: gestational diabetes, A2 on glyburide    Discharge diagnosis: Term Pregnancy Delivered                                              Post partum procedures: none Augmentation: AROM and Pitocin Complications: None  Hospital course: Induction of Labor With Vaginal Delivery   35 y.o. yo G3P3003 at [redacted]w[redacted]d was admitted to the hospital 08/31/2021 for induction of labor.  Indication for induction: A2 DM.  Patient had an uncomplicated labor course as follows: Membrane Rupture Time/Date: 9:08 AM ,08/31/2021   Delivery Method:Vaginal, Spontaneous  Episiotomy: None  Lacerations:  2nd degree;Perineal  Details of delivery can be found in separate delivery note.  Patient had a routine postpartum course. Patient is discharged home 09/02/21.  Newborn Data: Birth date:08/31/2021  Birth time:7:35 PM  Gender:Female  Living status:Living  Apgars:9 ,9  Weight:3439 g    Physical exam  Vitals:   09/01/21 0925 09/01/21 1438 09/01/21 2105 09/02/21 0511  BP: 110/68 120/80 116/69 122/84  Pulse: 61 75 66 74  Resp: 17 16 17 18   Temp: 97.7 F (36.5 C) 97.8 F (36.6 C) 98.8 F (37.1 C) 98.4 F (36.9 C)  TempSrc: Oral Oral Oral Oral  SpO2: 99%  100% 100%  Weight:      Height:       General: alert Lochia: appropriate Uterine Fundus: firm Incision: N/A DVT Evaluation: No evidence of DVT seen on physical exam. Labs: Lab Results  Component Value Date   WBC 15.8 (H) 09/01/2021   HGB 9.6 (L) 09/01/2021   HCT 28.3 (L) 09/01/2021   MCV  86.8 09/01/2021   PLT 312 09/01/2021   No flowsheet data found. Edinburgh Score: Edinburgh Postnatal Depression Scale Screening Tool 09/01/2021  I have been able to laugh and see the funny side of things. 0  I have looked forward with enjoyment to things. 0  I have blamed myself unnecessarily when things went wrong. 0  I have been anxious or worried for no good reason. 0  I have felt scared or panicky for no good reason. 0  Things have been getting on top of me. 0  I have been so unhappy that I have had difficulty sleeping. 2  I have felt sad or miserable. 2  I have been so unhappy that I have been crying. 2  The thought of harming myself has occurred to me. 0  Edinburgh Postnatal Depression Scale Total 6      After visit meds:  Allergies as of 09/02/2021       Reactions   Penicillins Rash   Has patient had a PCN reaction causing immediate rash, facial/tongue/throat swelling, SOB or lightheadedness with hypotension: Unknown Has patient had a PCN reaction causing severe rash involving mucus membranes or skin necrosis: Unknown Has patient had a PCN reaction that required hospitalization Unknown Has patient had a PCN reaction occurring within the last 10 years: No If all  of the above answers are "NO", then may proceed with Cephalosporin use.        Medication List     STOP taking these medications    ferrous sulfate 325 (65 FE) MG tablet   glyBURIDE 2.5 MG tablet Commonly known as: DIABETA       TAKE these medications    docusate sodium 100 MG capsule Commonly known as: Colace Take 1 capsule (100 mg total) by mouth 2 (two) times daily.   hydrocortisone 2.5 % rectal cream Commonly known as: ANUSOL-HC Place 1 application rectally 2 (two) times daily.   ibuprofen 600 MG tablet Commonly known as: ADVIL Take 1 tablet (600 mg total) by mouth every 6 (six) hours as needed.   levothyroxine 100 MCG tablet Commonly known as: SYNTHROID Take 150 mcg by mouth daily  before breakfast.   oxyCODONE 5 MG immediate release tablet Commonly known as: Roxicodone Take 0.5-1 tablets (2.5-5 mg total) by mouth every 6 (six) hours as needed for severe pain.   prenatal multivitamin Tabs tablet Take 1 tablet by mouth at bedtime.   PROZAC PO Take by mouth.         Discharge home in stable condition Infant Feeding: Bottle Infant Disposition:home with mother Discharge instruction: per After Visit Summary and Postpartum booklet. Activity: Advance as tolerated. Pelvic rest for 6 weeks.  Diet: routine diet Anticipated Birth Control: Unsure Postpartum Appointment:4 weeks  Future Appointments:No future appointments. Follow up Visit:  Follow-up Information     Carrington Clamp, MD Follow up in 4 week(s).   Specialty: Obstetrics and Gynecology Contact information: [address] [place] [address] [phone]                     09/02/2021 Katrina [name] Lizabeth Leyden, MD

## 2021-09-01 NOTE — Anesthesia Postprocedure Evaluation (Signed)
Anesthesia Post Note  Patient: Financial controller  Procedure(s) Performed: AN AD HOC LABOR EPIDURAL     Patient location during evaluation: Mother Baby Anesthesia Type: Epidural Level of consciousness: awake and alert Pain management: pain level controlled Vital Signs Assessment: post-procedure vital signs reviewed and stable Respiratory status: spontaneous breathing, nonlabored ventilation and respiratory function stable Cardiovascular status: stable Postop Assessment: no headache, no backache, epidural receding, no apparent nausea or vomiting, patient able to bend at knees, adequate PO intake and able to ambulate Anesthetic complications: no   No notable events documented.  Last Vitals:  Vitals:   09/01/21 0211 09/01/21 0520  BP: (!) 111/59 112/72  Pulse: 79 76  Resp: 16 18  Temp: 36.8 C 36.9 C  SpO2: 98% 99%    Last Pain:  Vitals:   09/01/21 0625  TempSrc:   PainSc: 8    Pain Goal:                   Laban Emperor

## 2021-09-01 NOTE — Progress Notes (Signed)
Patient ID: Katrina [name], female   DOB: 09/09/1986, 35 y.o.   MRN: [id number] Bladder scan with 450cc urine noted. Pt was also able to void a small amount shortly afterwards  Monitor for now

## 2021-09-01 NOTE — Progress Notes (Signed)
RN called into room by pt. Pt reports having tried to void for at least 20 minutes with no luck. Pt also reporting increasing pain in abdomen. RN bladder scanned pt and it showed >219 ml. Straight cath performed. Output was 700 ml. RN explained plan of care to pt including encouraging pt to try to void every 2-3 hours over the next 6 hours and keep up on fluid intake. If pt unable to void, bladder scan will be repeated. Pt states understanding at this time.

## 2021-09-01 NOTE — Progress Notes (Addendum)
Post Partum Day 1 Subjective: up ad lib, tolerating PO, + flatus, and lochia mild. Pain well controlled with medications. Pt reports difficulty voiding - had one spontaneous void shortly after delivery but no longer feels an urge. Had to be catheterized twice per nursing. Bonding well with baby - bottle-feeding. Requests circumcision for baby   Objective: Blood pressure 110/68, pulse 61, temperature 97.7 F (36.5 C), temperature source Oral, resp. rate 17, height 5\' 3"  (1.6 m), weight 83.1 kg, last menstrual period 11/14/2020, SpO2 99 %, unknown if currently breastfeeding.  Physical Exam:  General: alert, cooperative, and no distress Lochia: appropriate Uterine Fundus: firm Incision: n/a DVT Evaluation: No evidence of DVT seen on physical exam. Negative Homan's sign.  Recent Labs    08/31/21 0921 09/01/21 0520  HGB 10.2* 9.6*  HCT 30.8* 28.3*    Assessment/Plan: Will bladder scan and place foley if retention >636ml Plan for discharge tomorrow, Circumcision prior to discharge, and Contraception unsure    LOS: 1 day   Katrina [name] W Warren Kugelman 09/01/2021, 10:33 AM

## 2021-09-01 NOTE — Progress Notes (Signed)
Patient ID: Katrina [name], female   DOB: 04-Sep-1986, 35 y.o.   MRN: [id number] RN notified me pt voided spontaneously  Routine pp care

## 2021-09-01 NOTE — Progress Notes (Signed)
Pt has not been able to void on her own since coming to the unit. Pt was in and out cathed at 2230 at which time 200 ml was put out. RN educated pt on importance of adequate fluid intake and at least trying to get up to the go to the bathroom every 2-3 hours. Pt states that she does not feel like she needs to go at this time but that she will drink more tea and try within in the next hour. RN explained need to try even if pt doesn't feel the need to void to prevent her bladder from getting too full. RN further explained the need to at least bladder scan pt if she does not void soon and the possible need for another in and out cath depending on the results of the bladder scan. Pt states understanding at this time.

## 2021-09-01 NOTE — Progress Notes (Signed)
MOB was referred for history of depression/anxiety. * Referral screened out by Clinical Social Worker because none of the following criteria appear to apply: ~ History of anxiety/depression during this pregnancy, or of post-partum depression following prior delivery. ~ Diagnosis of anxiety and/or depression within last 3 years OR * MOB's symptoms currently being treated with medication and/or therapy. Per chart review, MOB is currently prescribed/taking Prozac.  Please contact the Clinical Social Worker if needs arise, by MOB request, or if MOB scores greater than 9/yes to question 10 on Edinburgh Postpartum Depression Screen.  Taliah Porche, LCSW Clinical Social Worker [facility] Cell#: [phone]  

## 2021-09-02 ENCOUNTER — Encounter (HOSPITAL_COMMUNITY): Payer: Self-pay | Admitting: Obstetrics and Gynecology

## 2021-09-02 MED ORDER — OXYCODONE HCL 5 MG PO TABS: 2.5000 mg | ORAL_TABLET | Freq: Four times a day (QID) | ORAL | 0 refills | Status: DC | PRN

## 2021-09-02 MED ORDER — HYDROCORTISONE (PERIANAL) 2.5 % EX CREA: 1.0000 "application " | TOPICAL_CREAM | Freq: Two times a day (BID) | CUTANEOUS | 0 refills | Status: DC

## 2021-09-02 MED ORDER — IBUPROFEN 600 MG PO TABS: 600.0000 mg | ORAL_TABLET | Freq: Four times a day (QID) | ORAL | 0 refills | Status: DC | PRN

## 2021-09-02 NOTE — Discharge Instructions (Signed)
Please take ibuprofen 600 mg and acetaminophen (tylenol) 1000 mg every 6 hours for pain .  Use the oxycodone only if pain is not controlled with ibuprofen and acetaminophen.    You may take colace (docusate) 1-2x/day for constipation if needed

## 2021-09-02 NOTE — Progress Notes (Signed)
Patient is doing well.  She is ambulating, voiding, tolerating PO.  Struggled with some urinary retention yesterday, now voiding spontaneously and completely.  Pain control is good, though she has taken a few doses of percocet since delivery.  Lochia is appropriate  Vitals:   09/01/21 0925 09/01/21 1438 09/01/21 2105 09/02/21 0511  BP: 110/68 120/80 116/69 122/84  Pulse: 61 75 66 74  Resp: 17 16 17 18   Temp: 97.7 F (36.5 C) 97.8 F (36.6 C) 98.8 F (37.1 C) 98.4 F (36.9 C)  TempSrc: Oral Oral Oral Oral  SpO2: 99%  100% 100%  Weight:      Height:        NAD Fundus firm Ext: 1+ pedal edema  Lab Results  Component Value Date   WBC 15.8 (H) 09/01/2021   HGB 9.6 (L) 09/01/2021   HCT 28.3 (L) 09/01/2021   MCV 86.8 09/01/2021   PLT 312 09/01/2021    --/--/B POS (10/06 0850)/  A/P 35 y.o. G3P3003 PPD#2 s/p TSVD. Routine care.   Desires a small rx of oxycodone for discharge.  Discussed limiting dosing, scheduled ibuprofen/tylenol.  Avoiding constipation Expect d/c today.    [facility]

## 2021-09-11 ENCOUNTER — Telehealth (HOSPITAL_COMMUNITY): Payer: Self-pay

## 2021-09-11 NOTE — Telephone Encounter (Signed)
No answer. Left message to return nurse call.  Marcelino Duster [facility] 09/11/2021,1757

## 2023-02-04 ENCOUNTER — Ambulatory Visit: Payer: Managed Care, Other (non HMO) | Admitting: Physical Medicine and Rehabilitation

## 2023-02-04 ENCOUNTER — Telehealth: Payer: Self-pay | Admitting: Physical Medicine and Rehabilitation

## 2023-02-04 NOTE — Telephone Encounter (Signed)
Called pt 1X and left vm that PA Barnet Pall is not in office. Jinny Blossom is sick and pt needs to rescheduled for next week

## 2023-03-31 IMAGING — US US MFM OB FOLLOW-UP
1 series · 15 of 28 positions shown · non-contrast
Comparison: none

[Series 1: us mfm ob follow-up · 39 acquisitions, 15 frames shown]
[im 1/39]
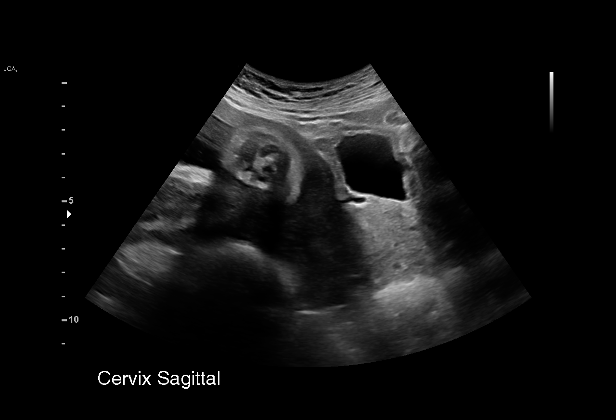
[im 3/39]
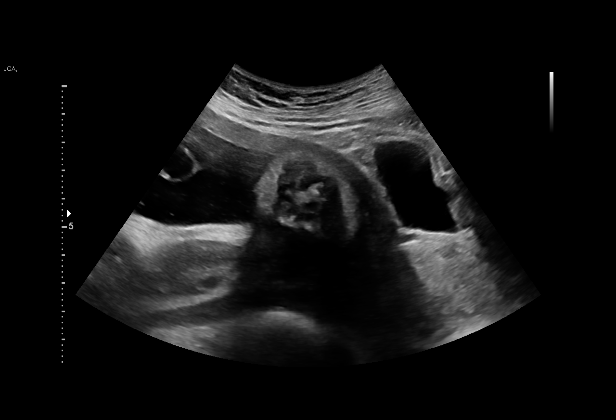
[im 6/39]
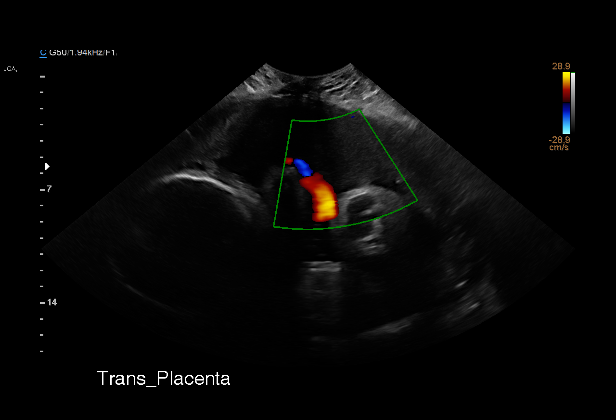
[im 9/39]
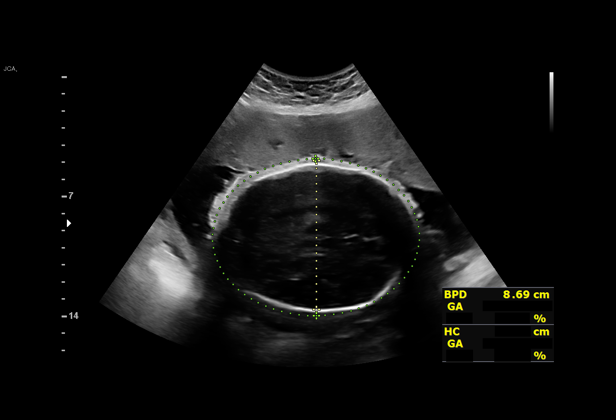
[im 12/39]
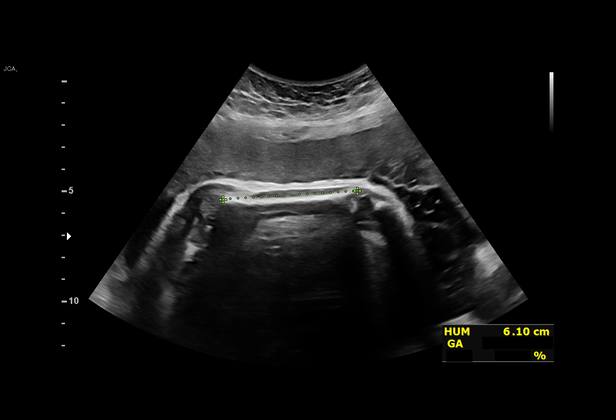
[im 15/39]
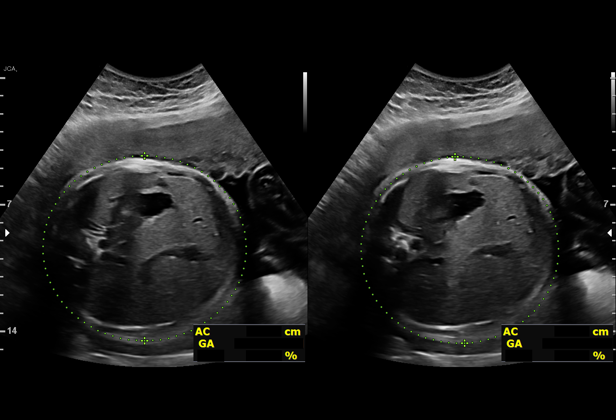
[im 17/39]
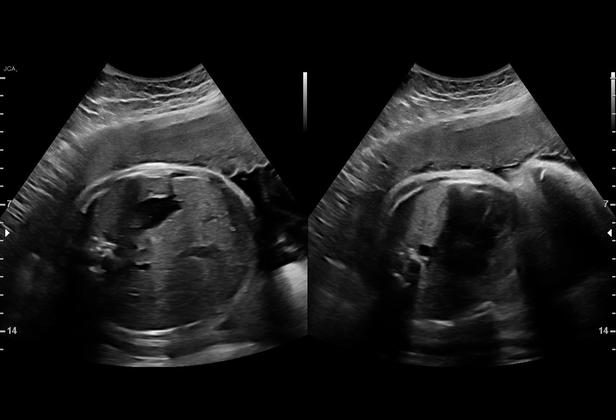
[im 20/39]
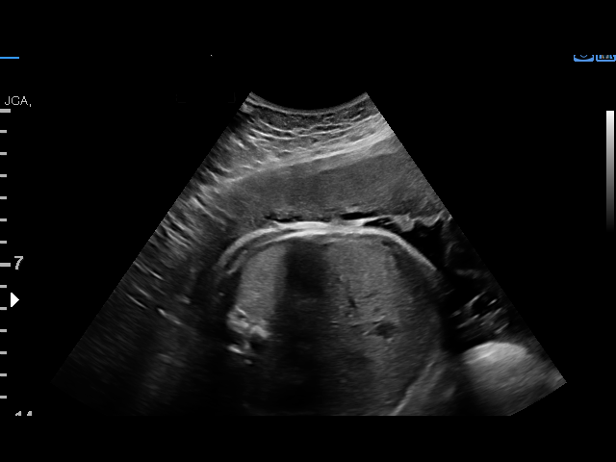
[im 22/39]
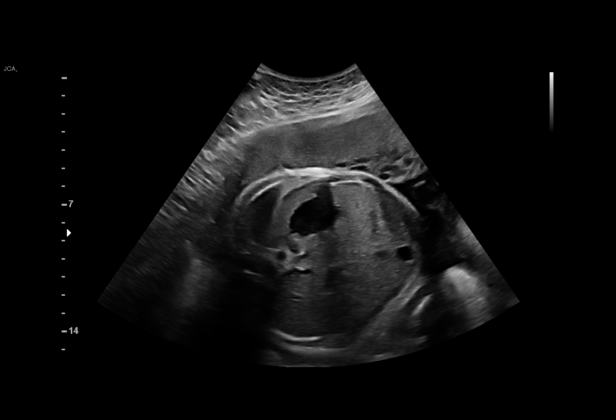
[im 24/39]
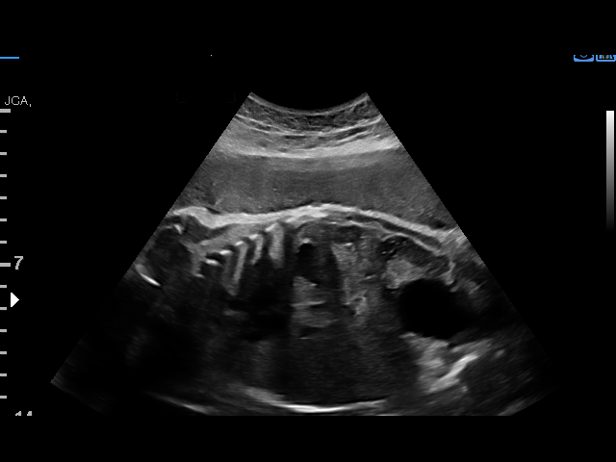
[im 27/39]
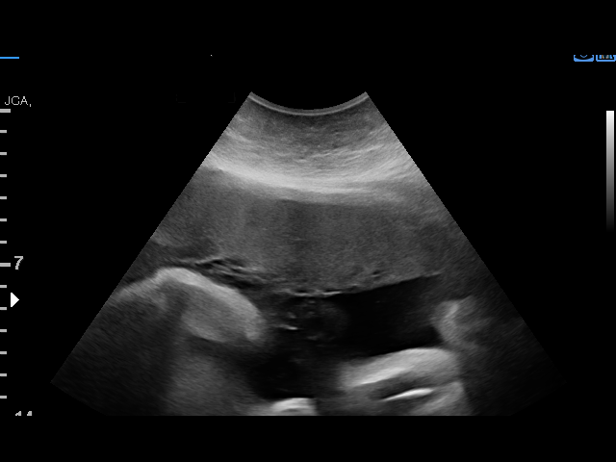
[im 30/39]
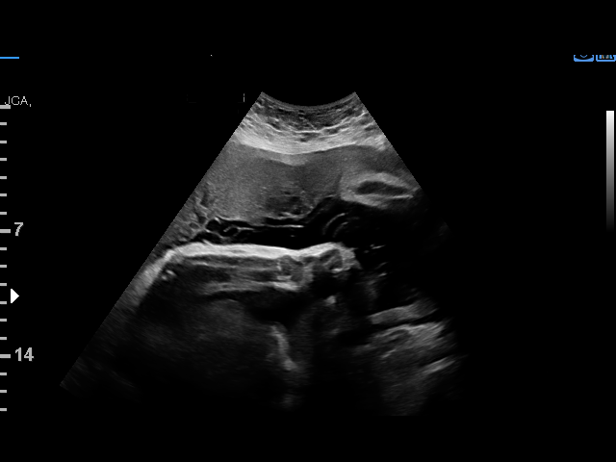
[im 33/39]
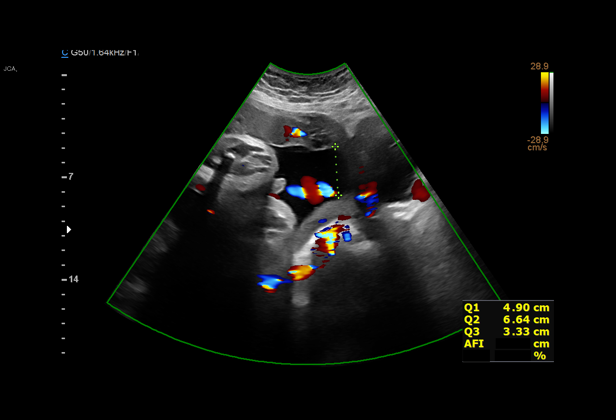
[im 36/39]
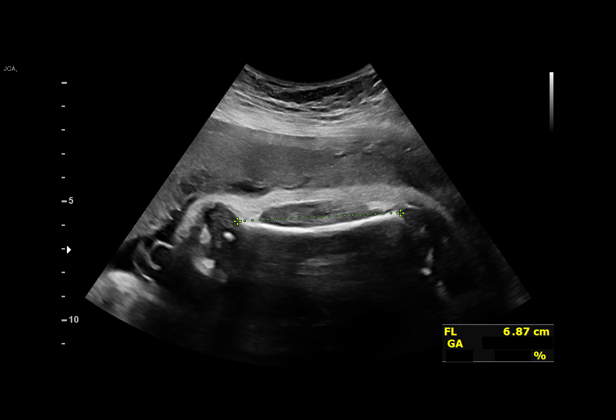
[im 39/39]
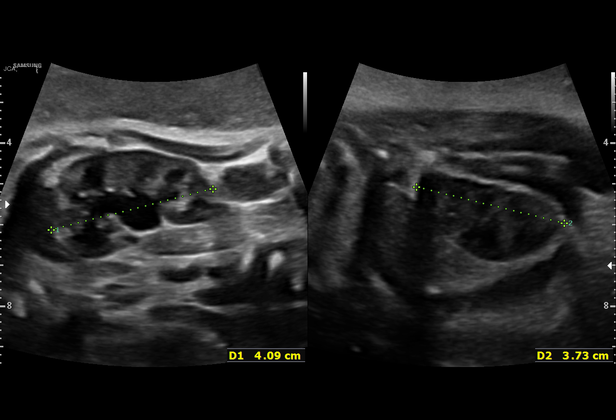

[15 of 28 positions shown; findings below may reference images not displayed]

Addendum:\.br----------------------------------------------------------------------

Indications

 Gestational diabetes in pregnancy, controlled
 by oral hypoglycemic drugs (Glyburide)
 Poor obstetrical history (cholestasis)
 Hypothyroid (synthroid)
 Encounter for other antenatal screening
 follow-up
 35 weeks gestation of pregnancy
 Declined genetic screening
Fetal Evaluation

 Num Of Fetuses:          1
 Fetal Heart Rate(bpm):   157
 Cardiac Activity:        Observed
 Presentation:            Breech
 Placenta:                Anterior

 Amniotic Fluid
 AFI FV:      Within normal limits

 AFI Sum(cm)     %Tile       Largest Pocket(cm)
 19.6            73
 RUQ(cm)       RLQ(cm)        LUQ(cm)        LLQ(cm)

Biophysical Evaluation

 Amniotic F.V:   Pocket => 2 cm              F. Tone:         Observed
 F. Movement:    Observed                    Score:           [DATE]
 F. Breathing:   Observed
Biometry

 BPD:      86.4   mm     G. Age:  34w 6d         38  %    CI:         67.44  %    70 - 86
                                                          FL/HC:       20.4  %    20.1 -
 HC:      336.8   mm     G. Age:  38w 4d         89  %    HC/AC:       1.01       0.93 -
 AC:      332.7   mm     G. Age:  37w 1d         94  %    FL/BPD:      79.5  %    71 - 87
 FL:       68.7   mm     G. Age:  35w 2d         39  %    FL/AC:       20.6  %    20 - 24
 HUM:      61.5   mm     G. Age:  35w 4d         68  %

 Est. FW:    1444   gm      6 lb 9 oz     80  %
OB History

 Gravidity:     3         Term:  2          Prem:  0        SAB:   0
 TOP:           0       Ectopic: 0         Living: 2
Gestational Age

 LMP:            37w 4d       Date:  11/14/20                   EDD:  08/21/21
 U/S Today:      36w 3d                                         EDD:  08/29/21
 Best:           35w 3d    Det. By:  Early Ultrasound           EDD:  09/05/21
                                     (01/17/21)
Anatomy

 Cranium:                Appears normal         LVOT:                   Previously seen
 Cavum:                  Previously seen        Aortic Arch:            Appears normal
 Ventricles:             Appears normal         Ductal Arch:            Previously seen
 Choroid Plexus:         Previously seen        Diaphragm:              Appears normal
 Cerebellum:             Previously seen        Stomach:                Appears normal, left
                                                                        sided
 Posterior Fossa:        Previously seen        Abdomen:                Appears normal
 Nuchal Fold:            Not applicable (>20    Abdominal Wall:         Previously seen
                         wks GA)
 Face:                   Orbits and profile     Cord Vessels:           Previously seen
                         previously seen
 Lips:                   Previously seen        Kidneys:                Appear normal
 Palate:                 Previously seen        Bladder:                Appears normal
 Thoracic:               Previously seen        Spine:                  Previously seen
 Heart:                  Appears normal         Upper Extremities:      Previously seen
                         (4CH, axis, and situs)
 RVOT:                   Previously seen        Lower Extremities:      Previously seen

 Other:   Bilateral pyelectasis resolved at KLAIDAS 5.4 mm and SCHLEGEL 4.4mm
Impression

 Gestational diabetes. Patient takes glyburide 2.5 mg twice daily
 for control .

 Amniotic fluid is normal and good fetal activity is seen .Fetal
 growth is appropriate for gestational age .Antenatal testing is
 reassuring. BPP [DATE].  Breech presentation.
Recommendations

 -Continue weekly BPP till delivery.
                      Grissett, Varsha

*** End of Addendum ***\.br----------------------------------------------------------------------

 Name:       AVALON JUMPER               Visit Date: 08/04/2021 [DATE]

Indications

 Gestational diabetes in pregnancy,
 controlled by oral hypoglycemic drugs
 (Glyburide)
 Poor obstetrical history (cholestasis)
 Hypothyroid (synthroid)
 Encounter for other antenatal screening
 follow-up
 35 weeks gestation of pregnancy
 Declined genetic screening
Fetal Evaluation

 Num Of Fetuses:         1
 Fetal Heart Rate(bpm):  157
 Cardiac Activity:       Observed
 Presentation:           Breech
 Placenta:               Anterior

 Amniotic Fluid
 AFI FV:      Within normal limits

 AFI Sum(cm)     %Tile       Largest Pocket(cm)
 19.6            73

 RUQ(cm)       RLQ(cm)       LUQ(cm)        LLQ(cm)

Biophysical Evaluation

 Amniotic F.V:   Pocket => 2 cm             F. Tone:        Observed
 F. Movement:    Observed                   Score:          [DATE]
 F. Breathing:   Observed
Biometry

 BPD:      86.4  mm     G. Age:  34w 6d         38  %    CI:        67.44   %    70 - 86
                                                         FL/HC:      20.4   %    20.1 -
 HC:      336.8  mm     G. Age:  38w 4d         89  %    HC/AC:      1.01        0.93 -
 AC:      332.7  mm     G. Age:  37w 1d         94  %    FL/BPD:     79.5   %    71 - 87
 FL:       68.7  mm     G. Age:  35w 2d         39  %    FL/AC:      20.6   %    20 - 24
 HUM:      61.5  mm     G. Age:  35w 4d         68  %

 Est. FW:    1444  gm      6 lb 9 oz     80  %
OB History

 Gravidity:    3         Term:   2        Prem:   0        SAB:   0
 TOP:          0       Ectopic:  0        Living: 2
Gestational Age

 LMP:           37w 4d        Date:  11/14/20                 EDD:   08/21/21
 U/S Today:     36w 3d                                        EDD:   08/29/21
 Best:          35w 3d     Det. By:  Early Ultrasound         EDD:   09/05/21
                                     (01/17/21)
Anatomy

 Cranium:               Appears normal         LVOT:                   Previously seen
 Cavum:                 Previously seen        Aortic Arch:            Appears normal
 Ventricles:            Appears normal         Ductal Arch:            Previously seen
 Choroid Plexus:        Previously seen        Diaphragm:              Appears normal
 Cerebellum:            Previously seen        Stomach:                Appears normal, left
                                                                       sided
 Posterior Fossa:       Previously seen        Abdomen:                Appears normal
 Nuchal Fold:           Not applicable (>20    Abdominal Wall:         Previously seen
                        wks GA)
 Face:                  Orbits and profile     Cord Vessels:           Previously seen
                        previously seen
 Lips:                  Previously seen        Kidneys:                Appear normal
 Palate:                Previously seen        Bladder:                Appears normal
 Thoracic:              Previously seen        Spine:                  Previously seen
 Heart:                 Appears normal         Upper Extremities:      Previously seen
                        (4CH, axis, and
                        situs)
 RVOT:                  Previously seen        Lower Extremities:      Previously seen

 Other:  Bilateral pyelectasis resolved at KLAIDAS 5.4 mm and SCHLEGEL 4.4mm
Impression

 Gestational diabetes. Patient takes glyburide 2.5 mg twice
 daily for control .

 Amniotic fluid is normal and good fetal activity is seen .Fetal
 growth is appropriate for gestational age .Antenatal testing is
 reassuring. BPP [DATE].  Breech presentation.
Recommendations

 -Continue weekly BPP till delivery.
                 Grissett, Varsha

## 2023-05-10 ENCOUNTER — Ambulatory Visit: Payer: Managed Care, Other (non HMO) | Admitting: Physical Medicine and Rehabilitation

## 2023-05-13 ENCOUNTER — Ambulatory Visit: Payer: Managed Care, Other (non HMO) | Admitting: Physical Medicine and Rehabilitation

## 2024-01-24 ENCOUNTER — Encounter (HOSPITAL_COMMUNITY): Payer: Self-pay | Admitting: *Deleted

## 2024-01-24 ENCOUNTER — Inpatient Hospital Stay (HOSPITAL_COMMUNITY)
Admission: AD | Admit: 2024-01-24 | Discharge: 2024-01-24 | Disposition: A | Discharge status: Home / Self Care | Payer: Medicaid Other | Attending: Family Medicine | Admitting: Family Medicine

## 2024-01-24 DIAGNOSIS — Z3A15 15 weeks gestation of pregnancy: Secondary | ICD-10-CM | POA: Insufficient documentation

## 2024-01-24 DIAGNOSIS — R1030 Lower abdominal pain, unspecified: Secondary | ICD-10-CM | POA: Diagnosis not present

## 2024-01-24 DIAGNOSIS — R10817 Generalized abdominal tenderness: Secondary | ICD-10-CM | POA: Diagnosis not present

## 2024-01-24 DIAGNOSIS — O26892 Other specified pregnancy related conditions, second trimester: Secondary | ICD-10-CM | POA: Diagnosis not present

## 2024-01-24 DIAGNOSIS — O26899 Other specified pregnancy related conditions, unspecified trimester: Secondary | ICD-10-CM

## 2024-01-24 DIAGNOSIS — R109 Unspecified abdominal pain: Secondary | ICD-10-CM | POA: Diagnosis present

## 2024-01-24 DIAGNOSIS — B3731 Acute candidiasis of vulva and vagina: Secondary | ICD-10-CM

## 2024-01-24 LAB — URINALYSIS, ROUTINE W REFLEX MICROSCOPIC
Bilirubin Urine: NEGATIVE
Glucose, UA: NEGATIVE mg/dL
Hgb urine dipstick: NEGATIVE
Ketones, ur: NEGATIVE mg/dL
Leukocytes,Ua: NEGATIVE
Nitrite: NEGATIVE
Protein, ur: NEGATIVE mg/dL
Specific Gravity, Urine: 1.01 (ref 1.005–1.030)
pH: 6 (ref 5.0–8.0)

## 2024-01-24 LAB — WET PREP, GENITAL
Clue Cells Wet Prep HPF POC: NONE SEEN
Sperm: NONE SEEN
Trich, Wet Prep: NONE SEEN
WBC, Wet Prep HPF POC: <10 (ref <10)

## 2024-01-24 MED ORDER — TERCONAZOLE 0.4 % VA CREA: 1.0000 | TOPICAL_CREAM | Freq: Every day | VAGINAL | 0 refills | Status: AC

## 2024-01-24 MED ORDER — ACETAMINOPHEN 500 MG PO TABS: 500.0000 mg | ORAL_TABLET | Freq: Four times a day (QID) | ORAL | 1 refills | Status: AC | PRN

## 2024-01-24 NOTE — MAU Provider Note (Signed)
 History     CSN: [id number]  Arrival date and time: 01/24/24 1745   Event Date/Time   First Provider Initiated Contact with Patient 01/24/24 2124      Chief Complaint  Patient presents with   Abdominal Pain    Katrina [name] is a 38 y.o. G4P3003 at [redacted]w[redacted]d who receives care at [facility]. Patient reports her next appt is March 6th.  She presents today for abdominal pain.  Patient reports pain started at 10pm last night and she took tylenol at 0100 and 0900 with some relief, but pain returns.  She states now the pain was gone upon arrival, but now has returned. She reports she feels like "contractions." She denies vaginal discharge, leaking, or bleeding.  She does endorse some back pain.   OB History     Gravida  4   Para  3   Term  3   Preterm      AB      Living  3      SAB      IAB      Ectopic      Multiple  0   Live Births  3           Past Medical History:  Diagnosis Date   Cholestasis    Depression    Gestational diabetes    History of cholestasis during pregnancy    Hypothyroidism     Past Surgical History:  Procedure Laterality Date   NO PAST SURGERIES      Family History  Problem Relation Age of Onset   Cancer Mother     Social History   Tobacco Use   Smoking status: Never   Smokeless tobacco: Never  Vaping Use   Vaping status: Never Used  Substance Use Topics   Alcohol use: Never   Drug use: No    Allergies:  Allergies  Allergen Reactions   Penicillins Rash    Has patient had a PCN reaction causing immediate rash, facial/tongue/throat swelling, SOB or lightheadedness with hypotension: Unknown Has patient had a PCN reaction causing severe rash involving mucus membranes or skin necrosis: Unknown Has patient had a PCN reaction that required hospitalization Unknown Has patient had a PCN reaction occurring within the last 10 years: No If all of the above answers are "NO", then may proceed with Cephalosporin use.      Medications Prior to Admission  Medication Sig Dispense Refill Last Dose/Taking   docusate sodium (COLACE) 100 MG capsule Take 1 capsule (100 mg total) by mouth 2 (two) times daily. (Patient not taking: No sig reported) 60 capsule 0    FLUoxetine HCl (PROZAC PO) Take by mouth.      hydrocortisone (ANUSOL-HC) 2.5 % rectal cream Place 1 application rectally 2 (two) times daily. 30 g 0    ibuprofen (ADVIL) 600 MG tablet Take 1 tablet (600 mg total) by mouth every 6 (six) hours as needed. 30 tablet 0    levothyroxine (SYNTHROID, LEVOTHROID) 100 MCG tablet Take 150 mcg by mouth daily before breakfast.      oxyCODONE (ROXICODONE) 5 MG immediate release tablet Take 0.5-1 tablets (2.5-5 mg total) by mouth every 6 (six) hours as needed for severe pain. 6 tablet 0    Prenatal Vit-Fe Fumarate-FA (PRENATAL MULTIVITAMIN) TABS tablet Take 1 tablet by mouth at bedtime.       Review of Systems  Gastrointestinal:  Positive for abdominal pain. Negative for constipation, diarrhea, nausea and vomiting.  Genitourinary:  Negative  for difficulty urinating, dysuria, vaginal bleeding and vaginal discharge.   Physical Exam   Blood pressure 96/60, pulse 70, temperature 97.9 F (36.6 C), temperature source Oral, resp. rate 16, height 5\' 3"  (1.6 m), weight 70.9 kg, last menstrual period 10/07/2023, unknown if currently breastfeeding.  Physical Exam Vitals and nursing note reviewed.  Constitutional:      Appearance: Normal appearance. She is well-developed.  Eyes:     Conjunctiva/sclera: Conjunctivae normal.  Cardiovascular:     Rate and Rhythm: Normal rate.  Pulmonary:     Effort: Pulmonary effort is normal. No respiratory distress.  Abdominal:     Palpations: Abdomen is soft.     Tenderness: There is generalized abdominal tenderness.  Musculoskeletal:        General: Normal range of motion.     Cervical back: Normal range of motion.  Skin:    General: Skin is warm and dry.  Neurological:     Mental  Status: She is alert and oriented to person, place, and time.  Psychiatric:        Mood and Affect: Mood normal.        Behavior: Behavior normal.     MAU Course  Procedures Results for orders placed or performed during the hospital encounter of 01/24/24 (from the past 24 hours)  Urinalysis, Routine w reflex microscopic -Urine, Clean Catch     Status: Abnormal   Collection Time: 01/24/24  7:39 PM  Result Value Ref Range   Color, Urine STRAW (A) YELLOW   APPearance CLEAR CLEAR   Specific Gravity, Urine 1.010 1.005 - 1.030   pH 6.0 5.0 - 8.0   Glucose, UA NEGATIVE NEGATIVE mg/dL   Hgb urine dipstick NEGATIVE NEGATIVE   Bilirubin Urine NEGATIVE NEGATIVE   Ketones, ur NEGATIVE NEGATIVE mg/dL   Protein, ur NEGATIVE NEGATIVE mg/dL   Nitrite NEGATIVE NEGATIVE   Leukocytes,Ua NEGATIVE NEGATIVE  Wet prep, genital     Status: Abnormal   Collection Time: 01/24/24  7:42 PM  Result Value Ref Range   Yeast Wet Prep HPF POC PRESENT (A) NONE SEEN   Trich, Wet Prep NONE SEEN NONE SEEN   Clue Cells Wet Prep HPF POC NONE SEEN NONE SEEN   WBC, Wet Prep HPF POC <10 <10   Sperm NONE SEEN    Patient informed that the ultrasound is considered a limited OB ultrasound and is not intended to be a complete ultrasound exam.  Patient also informed that the ultrasound is not being completed with the intent of assessing for fetal or placental anomalies or any pelvic abnormalities.  Explained that the purpose of today's ultrasound is to assess for   reassurance .  Patient acknowledges the purpose of the exam and the limitations of the study.  SIUP with FHR of 164. Good movement noted t/o exam.         MDM Physical Exam Cultures: Wet Prep and GC/CT Labs: UA Ultrasound Prescription Assessment and Plan  38 year old, G4P3003  SIUP at 15.4 weeks Abdominal Pain  -Orders placed while patient in triage. -Reviewed results with patient. -Discussed treatment with Terazol 7. -Exam performed.  -Patient  offered and declines pain medication.  However, patient requests prescription for tylenol to be sent to pharmacy.  -BSUS completed as above. -Precautions reviewed. -Instructed to follow up with primary ob as scheduled. -Discharged to home in stable condition.  Cherre Robins MSN, CNM Advanced Practice Provider, [facility] 01/24/2024, 9:24 PM

## 2024-01-24 NOTE — MAU Note (Addendum)
 Pt says [facility] -- has had 2 visits and U/S .  Next appointment  is 01-30-2024 Has been cramping- started last night at 10pm  8/10 Took 500mg  Tyl at 0100 and today 0900-  3/10. Did not call office . Now- pain 7/10 Last sex- Tues.  No Vomit  -loose BM's

## 2024-01-26 LAB — GC/CHLAMYDIA PROBE AMP (~~LOC~~) NOT AT ARMC
Chlamydia: NEGATIVE
Comment: NEGATIVE
Comment: NORMAL
Neisseria Gonorrhea: NEGATIVE

## 2024-06-03 ENCOUNTER — Ambulatory Visit: Attending: Obstetrics and Gynecology | Admitting: Physical Therapy

## 2024-06-03 ENCOUNTER — Encounter: Payer: Self-pay | Admitting: Physical Therapy

## 2024-06-03 ENCOUNTER — Other Ambulatory Visit: Payer: Self-pay

## 2024-06-03 DIAGNOSIS — M6281 Muscle weakness (generalized): Secondary | ICD-10-CM | POA: Insufficient documentation

## 2024-06-03 DIAGNOSIS — R293 Abnormal posture: Secondary | ICD-10-CM | POA: Insufficient documentation

## 2024-06-03 DIAGNOSIS — R279 Unspecified lack of coordination: Secondary | ICD-10-CM | POA: Diagnosis present

## 2024-06-03 NOTE — Therapy (Signed)
 OUTPATIENT PHYSICAL THERAPY FEMALE PELVIC EVALUATION   Patient Name: Zakaria Fromer MRN: [id number] DOB:Apr 26, 1986, 38 y.o., female Today's Date: 06/03/2024  END OF SESSION:  PT End of Session - 06/03/24 1241     Visit Number 1    Number of Visits 8    Date for PT Re-Evaluation 07/01/24    Authorization Type Medicaid [employer]    Authorization - Number of Visits 27    PT Start Time 1015    PT Stop Time 1100    PT Time Calculation (min) 45 min    Activity Tolerance Patient tolerated treatment well    Behavior During Therapy WFL for tasks assessed/performed          Past Medical History:  Diagnosis Date   Cholestasis    Depression    Gestational diabetes    History of cholestasis during pregnancy    Hypothyroidism    Past Surgical History:  Procedure Laterality Date   NO PAST SURGERIES     Patient Active Problem List   Diagnosis Date Noted   Encounter for planned induction of labor 08/31/2021   Gestational diabetes mellitus (GDM), antepartum 07/05/2021   Labor abnormal 09/23/2018   Postpartum state 02/06/2017   Cholestasis during pregnancy 02/05/2017    PCP: none per chart   REFERRING PROVIDER: Sarrah Browning, MD  REFERRING DIAG: N39.3 (ICD-10-CM) - Stress incontinence (female) (female)  THERAPY DIAG:  Muscle weakness (generalized)  Unspecified lack of coordination  Abnormal posture  Rationale for Evaluation and Treatment: Rehabilitation  ONSET DATE: 02/21/24  SUBJECTIVE:                                                                                                                                                                                           SUBJECTIVE STATEMENT: Patient reports to PFPT with pelvic pain and pressure in her vaginal region. This started after an abortion at [redacted] weeks gestation in March of this year - after the procedure, she started noticing increased pelvic pressure/heaviness and pain. She works second shift (10 hours) -  she feels this pressure and pelvic pain nightly. She has pain during sex. She has a cystocele and she does not want a hysterectomy right now.  Fluid intake: 4 cups of water a day, 1 cup of coffee a day for work days  PAIN:  Are you having pain? Yes NPRS scale: 3/10 Pain location: Internal, Deep, Bilateral, Vaginal, and Anterior  Pain type: aching and throbbing Pain description: intermittent   Aggravating factors: standing on feet for long shifts, intercourse, sitting for long periods of time   Relieving factors: heating pads, putting legs up in  the air on the sofa   PRECAUTIONS: None  RED FLAGS: None   WEIGHT BEARING RESTRICTIONS: No  FALLS:  Has patient fallen in last 6 months? No  OCCUPATION: works 10 hour shifts at [employer], 4x/wk, on feet for shift   ACTIVITY LEVEL : low   PLOF: Independent  PATIENT GOALS: decrease pelvic pain and pressure, get a stronger pelvic floor  BOWEL MOVEMENT: Pain with bowel movement: Yes Type of bowel movement:Type (Bristol Stool Scale) 3-4, Frequency 1x/day, Strain yes, and Splinting no Fully empty rectum: No Leakage: No Pads: No Fiber supplement/laxative No *has hemorrhoids and they flare when constipated*  URINATION: Pain with urination: Yes - when starting to urinate  Fully empty bladder: Yes:   Stream: Strong Urgency: Yes  Frequency: within normal limits - gets up 1x/night  Leakage: Urge to void, Coughing, Sneezing, Laughing, and Lifting Pads: No  INTERCOURSE:  Ability to have vaginal penetration Yes  Pain with intercourse: Initial Penetration, During Penetration, Deep Penetration, and After Intercourse DrynessYes  Climax: yes Marinoff Scale: 3/3 Lubricant: uses vaseline and a condom   PREGNANCY: Vaginal deliveries 3 Tearing Yes: with first delivery   PROLAPSE: Pressure and Bulge  OBJECTIVE:  Note: Objective measures were completed at Evaluation unless otherwise noted.  PATIENT SURVEYS:  PFIQ-7:  66  COGNITION: Overall cognitive status: Within functional limits for tasks assessed     SENSATION: Light touch: Appears intact  LUMBAR SPECIAL TESTS:  Single leg stance test: Positive  FUNCTIONAL TESTS:  Squat: bilateral dynamic knee valgus with loading, general lumbopelvic weakness present   GAIT: Assistive device utilized: None Comments: mild trendelenburg gait pattern with ambulation   POSTURE: rounded shoulders and forward head   LUMBARAROM/PROM: within functional limits bilaterally   LOWER EXTREMITY ROM: within functional limits bilaterally   LOWER EXTREMITY MMT: 4/5 bilateral knees and hips grossly  PALPATION:   General: no significant tenderness to palpation of bilateral adductors or hip flexors   Pelvic Alignment: within normal limits   Abdominal: upper chest breathing and abdominal bracing at rest, decreased lower rib excursion with inhalation                 External Perineal Exam: minimal dryness present with sufficient clitoral hood mobility, no visible prolapse in hooklying                             Internal Pelvic Floor: Patient fully consents to today's internal vaginal examination. She could feel pressure with palpation of superficial and deep pelvic floor musculature bilaterally, but had no pain during today's exam. She demonstrates superficial and deep pelvic floor weakness and stiffness throughout the musculature. There is a lack of coordination present between the diaphragm and pelvic floor in hooklying, so we introduced pelvic floor AROM exercise in this position to decrease stiffness and improve coordination.   Patient confirms identification and approves PT to assess internal pelvic floor and treatment Yes No emotional/communication barriers or cognitive limitation. Patient is motivated to learn. Patient understands and agrees with treatment goals and plan. PT explains patient will be examined in standing, sitting, and lying down to see how their  muscles and joints work. When they are ready, they will be asked to remove their underwear so PT can examine their perineum. The patient is also given the option of providing their own chaperone as one is not provided in our facility. The patient also has the right and is explained the right to defer  or refuse any part of the evaluation or treatment including the internal exam. With the patient's consent, PT will use one gloved finger to gently assess the muscles of the pelvic floor, seeing how well it contracts and relaxes and if there is muscle symmetry. After, the patient will get dressed and PT and patient will discuss exam findings and plan of care. PT and patient discuss plan of care, schedule, attendance policy and HEP activities.  PELVIC MMT:   MMT eval  Vaginal 3/5, 10 quick flicks, 5 second hold   Internal Anal Sphincter   External Anal Sphincter   Puborectalis   Diastasis Recti 2 finger widths separation at umbilicus   (Blank rows = not tested)      TONE: Within normal limits bilaterally in superficial and deep pelvic floor musculature   PROLAPSE: Mild anterior vaginal wall laxity with cough test in hooklying   TODAY'S TREATMENT:                                                                                                                              DATE:   EVAL 06/03/24: Examination completed, findings reviewed, pt educated on POC. Pt motivated to participate in PT and agreeable to attempt recommendations.   Neuro re-ed: Hooklying diaphragmatic breathing + pelvic floor lengthening with inhalation and shortening with exhalation 3x10  Hooklying quick flick pelvic floor contractions + diaphragmatic breathing 3x10  For all possible CPT codes, reference the Planned Interventions line above.     Check all conditions that are expected to impact treatment: {Conditions expected to impact treatment:None of these apply   If treatment provided at initial evaluation, no treatment charged  due to lack of authorization.     PATIENT EDUCATION:  Education details: [email or web address] Person educated: Patient Education method: Explanation, Demonstration, Actor cues, Verbal cues, and Handouts Education comprehension: verbalized understanding, returned demonstration, verbal cues required, tactile cues required, and needs further education  HOME EXERCISE PROGRAM: Access Code: [email or web address] URL: [email or web address] Date: 06/03/2024 Prepared by: Celena Domino  Exercises - Supine Pelvic Floor Contraction  - 1 x daily - 7 x weekly - 3 sets - 10 reps - Quick Flick Pelvic Floor Contractions in Hooklying  - 1 x daily - 7 x weekly - 3 sets - 10 reps  ASSESSMENT:  CLINICAL IMPRESSION: Patient is a 38 y.o. female  who was seen today for physical therapy evaluation and treatment for pelvic heaviness/pain/incontinence/dyspareunia. These symptoms began in March of this year following a medical abortion. She is in mild pain at rest, but with prolonged standing, her pelvic pressure/pain increases. Patient fully consents to today's internal vaginal examination. She could feel pressure with palpation of superficial and deep pelvic floor musculature bilaterally, but had no pain during today's exam. She demonstrates superficial and deep pelvic floor weakness and stiffness throughout the musculature. There is a lack of coordination present between the diaphragm and pelvic floor in hooklying, so we introduced pelvic floor AROM exercise in  this position to decrease stiffness and improve coordination. Overall, patient tolerated session well and Pt would benefit from additional PT to further address deficits.   OBJECTIVE IMPAIRMENTS: decreased coordination, decreased endurance, decreased mobility, decreased ROM, decreased strength, and pain.   ACTIVITY LIMITATIONS: continence  PARTICIPATION LIMITATIONS: occupation  PERSONAL FACTORS: Past/current experiences and Time since onset of  injury/illness/exacerbation are also affecting patient's functional outcome.   REHAB POTENTIAL: Good  CLINICAL DECISION MAKING: Stable/uncomplicated  EVALUATION COMPLEXITY: Low   GOALS: Goals reviewed with patient? Yes  SHORT TERM GOALS: Target date: 07/01/2024  Pt will be independent with HEP.  Baseline: Goal status: INITIAL  2.  Pt will be independent with the knack, urge suppression technique, and double voiding in order to improve bladder habits and decrease urinary incontinence.   Baseline:  Goal status: INITIAL  3.  Pt will be independent with use of squatty potty, relaxed toileting mechanics, and improved bowel movement techniques in order to increase ease of bowel movements and complete evacuation.   Baseline:  Goal status: INITIAL  4.  Pt will be able to correctly perform diaphragmatic breathing and appropriate pressure management in order to prevent worsening vaginal wall laxity and improve pelvic floor A/ROM.   Baseline:  Goal status: INITIAL  LONG TERM GOALS: Target date: 12/04/2024  Pt will be independent with advanced HEP.  Baseline:  Goal status: INITIAL  2.  Pt to demonstrate improved coordination of pelvic floor and breathing mechanics with 10# squat with appropriate synergistic patterns to decrease pain and leakage at least 75% of the time for improved ability to complete a 30 minute workout with strain at pelvic floor and symptoms.   Baseline:  Goal status: INITIAL  3.  Pt will report no episodes of urinary incontinence in order to improve confidence in community activities and personal hygiene. Baseline:  Goal status: INITIAL  4.  Pt will report 0/10 pain with vaginal penetration in order to improve intimate relationship with partner and to suggest improved pelvic floor control at rest.    Baseline:  Goal status: INITIAL  5.  Pt will demonstrate 4/5 pelvic floor muscle strength with appropriate coordination in order to decrease urinary incontinence,  urgency and frequency.   Baseline:  Goal status: INITIAL  PLAN:  PT FREQUENCY: 1-2x/week  PT DURATION: 12 weeks  PLANNED INTERVENTIONS: 97110-Therapeutic exercises, 97530- Therapeutic activity, 97112- Neuromuscular re-education, 97535- Self Care, [id number]- Manual therapy, Patient/Family education, Taping, Joint mobilization, Spinal mobilization, Scar mobilization, Cryotherapy, and Moist heat  PLAN FOR NEXT SESSION: continued pelvic floor AROM in seated, introduce knack technique and urge drill, introduce hip glute and core strengthening, toileting mechanics for optimal emptying  Celena JAYSON Domino, PT 06/03/2024, 12:42 PM

## 2024-06-05 ENCOUNTER — Telehealth: Payer: Self-pay | Admitting: Physical Therapy

## 2024-06-05 NOTE — Telephone Encounter (Signed)
 Patient informed to rest due to muscle soreness from HEP and resume exercises on Monday. She reports understanding and will call back if pain does not improve.  Celena Domino, PT, DPT 06/05/24 10:43 AM

## 2024-06-18 ENCOUNTER — Ambulatory Visit: Admitting: Physical Therapy

## 2024-06-18 DIAGNOSIS — M6281 Muscle weakness (generalized): Secondary | ICD-10-CM | POA: Diagnosis not present

## 2024-06-18 NOTE — Therapy (Signed)
 OUTPATIENT PHYSICAL THERAPY FEMALE PELVIC TREATMENT   Patient Name: Katrina [name] MRN: [id number] DOB:Apr 13, 1986, 38 y.o., female Today's Date: 06/18/2024  END OF SESSION:  PT End of Session - 06/18/24 0928     Visit Number 2    Number of Visits 8    Date for PT Re-Evaluation 07/01/24    Authorization Type Medicaid [employer]    Authorization - Number of Visits 27    PT Start Time 0845    PT Stop Time 0930    PT Time Calculation (min) 45 min    Activity Tolerance Patient tolerated treatment well    Behavior During Therapy WFL for tasks assessed/performed           Past Medical History:  Diagnosis Date   Cholestasis    Depression    Gestational diabetes    History of cholestasis during pregnancy    Hypothyroidism    Past Surgical History:  Procedure Laterality Date   NO PAST SURGERIES     Patient Active Problem List   Diagnosis Date Noted   Encounter for planned induction of labor 08/31/2021   Gestational diabetes mellitus (GDM), antepartum 07/05/2021   Labor abnormal 09/23/2018   Postpartum state 02/06/2017   Cholestasis during pregnancy 02/05/2017    PCP: none per chart   REFERRING PROVIDER: Sarrah Browning, MD  REFERRING DIAG: N39.3 (ICD-10-CM) - Stress incontinence (female) (female)  THERAPY DIAG:  Muscle weakness (generalized)  Rationale for Evaluation and Treatment: Rehabilitation  ONSET DATE: 02/21/24  SUBJECTIVE:                                                                                                                                                                                           SUBJECTIVE STATEMENT: Patient reports that she is doing well today. She has been consistent with HEP, but she is worried because she is still feeling the vaginal pressure. She is still having painful intercourse (3/10), and she is feeling constipated.   From eval: Patient reports to PFPT with pelvic pain and pressure in her vaginal region. This  started after an abortion at [redacted] weeks gestation in March of this year - after the procedure, she started noticing increased pelvic pressure/heaviness and pain. She works second shift (10 hours) - she feels this pressure and pelvic pain nightly. She has pain during sex. She has a cystocele and she does not want a hysterectomy right now.  Fluid intake: 4 cups of water a day, 1 cup of coffee a day for work days  PAIN:  Are you having pain? Yes NPRS scale: 3/10 Pain location: Internal, Deep, Bilateral, Vaginal, and Anterior  Pain type: aching and throbbing Pain description: intermittent   Aggravating factors: standing on feet for long shifts, intercourse, sitting for long periods of time   Relieving factors: heating pads, putting legs up in the air on the sofa   PRECAUTIONS: None  RED FLAGS: None   WEIGHT BEARING RESTRICTIONS: No  FALLS:  Has patient fallen in last 6 months? No  OCCUPATION: works 10 hour shifts at [employer], 4x/wk, on feet for shift   ACTIVITY LEVEL : low   PLOF: Independent  PATIENT GOALS: decrease pelvic pain and pressure, get a stronger pelvic floor  BOWEL MOVEMENT: Pain with bowel movement: Yes Type of bowel movement:Type (Bristol Stool Scale) 3-4, Frequency 1x/day, Strain yes, and Splinting no Fully empty rectum: No Leakage: No Pads: No Fiber supplement/laxative No *has hemorrhoids and they flare when constipated*  URINATION: Pain with urination: Yes - when starting to urinate  Fully empty bladder: Yes:   Stream: Strong Urgency: Yes  Frequency: within normal limits - gets up 1x/night  Leakage: Urge to void, Coughing, Sneezing, Laughing, and Lifting Pads: No  INTERCOURSE:  Ability to have vaginal penetration Yes  Pain with intercourse: Initial Penetration, During Penetration, Deep Penetration, and After Intercourse DrynessYes  Climax: yes Marinoff Scale: 3/3 Lubricant: uses vaseline and a condom   PREGNANCY: Vaginal deliveries 3 Tearing Yes:  with first delivery   PROLAPSE: Pressure and Bulge  OBJECTIVE:  Note: Objective measures were completed at Evaluation unless otherwise noted.  PATIENT SURVEYS:  PFIQ-7: 49  COGNITION: Overall cognitive status: Within functional limits for tasks assessed     SENSATION: Light touch: Appears intact  LUMBAR SPECIAL TESTS:  Single leg stance test: Positive  FUNCTIONAL TESTS:  Squat: bilateral dynamic knee valgus with loading, general lumbopelvic weakness present   GAIT: Assistive device utilized: None Comments: mild trendelenburg gait pattern with ambulation   POSTURE: rounded shoulders and forward head   LUMBARAROM/PROM: within functional limits bilaterally   LOWER EXTREMITY ROM: within functional limits bilaterally   LOWER EXTREMITY MMT: 4/5 bilateral knees and hips grossly  PALPATION:   General: no significant tenderness to palpation of bilateral adductors or hip flexors   Pelvic Alignment: within normal limits   Abdominal: upper chest breathing and abdominal bracing at rest, decreased lower rib excursion with inhalation                 External Perineal Exam: minimal dryness present with sufficient clitoral hood mobility, no visible prolapse in hooklying                             Internal Pelvic Floor: Patient fully consents to today's internal vaginal examination. She could feel pressure with palpation of superficial and deep pelvic floor musculature bilaterally, but had no pain during today's exam. She demonstrates superficial and deep pelvic floor weakness and stiffness throughout the musculature. There is a lack of coordination present between the diaphragm and pelvic floor in hooklying, so we introduced pelvic floor AROM exercise in this position to decrease stiffness and improve coordination.   Patient confirms identification and approves PT to assess internal pelvic floor and treatment Yes No emotional/communication barriers or cognitive limitation. Patient is  motivated to learn. Patient understands and agrees with treatment goals and plan. PT explains patient will be examined in standing, sitting, and lying down to see how their muscles and joints work. When they are ready, they will be asked to remove their underwear so PT can examine  their perineum. The patient is also given the option of providing their own chaperone as one is not provided in our facility. The patient also has the right and is explained the right to defer or refuse any part of the evaluation or treatment including the internal exam. With the patient's consent, PT will use one gloved finger to gently assess the muscles of the pelvic floor, seeing how well it contracts and relaxes and if there is muscle symmetry. After, the patient will get dressed and PT and patient will discuss exam findings and plan of care. PT and patient discuss plan of care, schedule, attendance policy and HEP activities.  PELVIC MMT:   MMT eval  Vaginal 3/5, 10 quick flicks, 5 second hold   Internal Anal Sphincter   External Anal Sphincter   Puborectalis   Diastasis Recti 2 finger widths separation at umbilicus   (Blank rows = not tested)      TONE: Within normal limits bilaterally in superficial and deep pelvic floor musculature   PROLAPSE: Mild anterior vaginal wall laxity with cough test in hooklying   TODAY'S TREATMENT:                                                                                                                              DATE:   EVAL 06/03/24: Examination completed, findings reviewed, pt educated on POC. Pt motivated to participate in PT and agreeable to attempt recommendations.   Neuro re-ed: Hooklying diaphragmatic breathing + pelvic floor lengthening with inhalation and shortening with exhalation 3x10  Hooklying quick flick pelvic floor contractions + diaphragmatic breathing 3x10  For all possible CPT codes, reference the Planned Interventions line above.     Check all  conditions that are expected to impact treatment: {Conditions expected to impact treatment:None of these apply   If treatment provided at initial evaluation, no treatment charged due to lack of authorization.    06/18/24:  Neuro re-ed: standing diaphragmatic breathing + pelvic floor lengthening with inhalation and shortening with exhalation 3x10  Hooklying quick flick pelvic floor contractions + diaphragmatic breathing 3x10  Cat/cow + diaphragmatic breathing 2x10  Supine butterfly stretch + diaphragmatic breathing 2x78min  Lower trunk rotations + diaphragmatic breathing 2x60min  Manual therapy: Internal vaginal treatment to reassess pelvic floor AROM and coordination - review of HEP Self care: Water intake recommendations: half of body weight in fluid oz  Lubricant recommendations for sensitivity  Vaginal moisturizer recommendations for sensitivity   PATIENT EDUCATION:  Education details: Psychologist, educational Person educated: Patient Education method: Programmer, multimedia, Demonstration, Actor cues, Verbal cues, and Handouts Education comprehension: verbalized understanding, returned demonstration, verbal cues required, tactile cues required, and needs further education  HOME EXERCISE PROGRAM: Access Code: [email or web address] URL: [email or web address] Date: 06/18/2024 Prepared by: Celena Domino  Exercises - Standing Pelvic Floor Contraction  - 1 x daily - 7 x weekly - 2 sets - 10 reps - Quick Flick Pelvic Floor Contractions in Hooklying  - 1 x daily -  7 x weekly - 2 sets - 10 reps - Cat Cow  - 1 x daily - 7 x weekly - 2 sets - 10 reps - Supine Butterfly Groin Stretch  - 1 x daily - 7 x weekly - 2 sets - hold - Supine Lower Trunk Rotation  - 1 x daily - 7 x weekly - 2 sets - 10 reps  ASSESSMENT:  CLINICAL IMPRESSION: Patient is a 38 y.o. female  who was seen today for physical therapy evaluation and treatment for pelvic heaviness/pain/incontinence/dyspareunia. Patient has been consistent with  HEP, but she is still experiencing the same level of pelvic pain. Her pelvic pressure has been improving, and her coordination with pelvic floor contractions has also improved significantly since eval. We progressed her pelvic floor training to standing today to incorporate more gravitational load. Lumbopelvic mobility/downtraining stretches introduced to decrease baseline tension at rest, and these were tolerable for the patient. 0/10 pain at end of session. Overall, patient tolerated session well and Pt would benefit from additional PT to further address deficits.   OBJECTIVE IMPAIRMENTS: decreased coordination, decreased endurance, decreased mobility, decreased ROM, decreased strength, and pain.   ACTIVITY LIMITATIONS: continence  PARTICIPATION LIMITATIONS: occupation  PERSONAL FACTORS: Past/current experiences and Time since onset of injury/illness/exacerbation are also affecting patient's functional outcome.   REHAB POTENTIAL: Good  CLINICAL DECISION MAKING: Stable/uncomplicated  EVALUATION COMPLEXITY: Low   GOALS: Goals reviewed with patient? Yes  SHORT TERM GOALS: Target date: 07/01/2024  Pt will be independent with HEP.  Baseline: Goal status: INITIAL  2.  Pt will be independent with the knack, urge suppression technique, and double voiding in order to improve bladder habits and decrease urinary incontinence.   Baseline:  Goal status: INITIAL  3.  Pt will be independent with use of squatty potty, relaxed toileting mechanics, and improved bowel movement techniques in order to increase ease of bowel movements and complete evacuation.   Baseline:  Goal status: INITIAL  4.  Pt will be able to correctly perform diaphragmatic breathing and appropriate pressure management in order to prevent worsening vaginal wall laxity and improve pelvic floor A/ROM.   Baseline:  Goal status: INITIAL  LONG TERM GOALS: Target date: 12/04/2024  Pt will be independent with advanced HEP.   Baseline:  Goal status: INITIAL  2.  Pt to demonstrate improved coordination of pelvic floor and breathing mechanics with 10# squat with appropriate synergistic patterns to decrease pain and leakage at least 75% of the time for improved ability to complete a 30 minute workout with strain at pelvic floor and symptoms.   Baseline:  Goal status: INITIAL  3.  Pt will report no episodes of urinary incontinence in order to improve confidence in community activities and personal hygiene. Baseline:  Goal status: INITIAL  4.  Pt will report 0/10 pain with vaginal penetration in order to improve intimate relationship with partner and to suggest improved pelvic floor control at rest.    Baseline:  Goal status: INITIAL  5.  Pt will demonstrate 4/5 pelvic floor muscle strength with appropriate coordination in order to decrease urinary incontinence, urgency and frequency.   Baseline:  Goal status: INITIAL  PLAN:  PT FREQUENCY: 1-2x/week  PT DURATION: 12 weeks  PLANNED INTERVENTIONS: 97110-Therapeutic exercises, 97530- Therapeutic activity, 97112- Neuromuscular re-education, 97535- Self Care, [id number]- Manual therapy, Patient/Family education, Taping, Joint mobilization, Spinal mobilization, Scar mobilization, Cryotherapy, and Moist heat  PLAN FOR NEXT SESSION: continued pelvic floor AROM in seated, introduce knack technique and urge drill,  introduce hip glute and core strengthening, toileting mechanics for optimal emptying  Celena JAYSON Domino, PT 06/18/2024, 9:29 AM

## 2024-07-01 ENCOUNTER — Ambulatory Visit: Attending: Obstetrics and Gynecology | Admitting: Physical Therapy

## 2024-07-01 DIAGNOSIS — R293 Abnormal posture: Secondary | ICD-10-CM | POA: Insufficient documentation

## 2024-07-01 DIAGNOSIS — M6281 Muscle weakness (generalized): Secondary | ICD-10-CM | POA: Insufficient documentation

## 2024-07-01 DIAGNOSIS — R279 Unspecified lack of coordination: Secondary | ICD-10-CM | POA: Insufficient documentation

## 2024-07-06 ENCOUNTER — Encounter: Payer: Self-pay | Admitting: Physical Therapy

## 2024-07-23 ENCOUNTER — Ambulatory Visit: Admitting: Physical Therapy

## 2024-07-28 ENCOUNTER — Ambulatory Visit: Admitting: Physical Therapy

## 2024-10-15 ENCOUNTER — Ambulatory Visit: Attending: Obstetrics and Gynecology | Admitting: Physical Therapy

## 2025-02-03 ENCOUNTER — Ambulatory Visit (HOSPITAL_COMMUNITY): Admit: 2025-02-03 | Admitting: Obstetrics and Gynecology
